# Patient Record
Sex: Female | Born: 1965
Health system: Southern US, Community
[De-identification: ages and names within clinical notes are randomized; demographics above are authoritative.]

## PROBLEM LIST (undated history)

## (undated) ENCOUNTER — Ambulatory Visit: Admission: EM

## (undated) DIAGNOSIS — F32A Depression, unspecified: Secondary | ICD-10-CM

## (undated) DIAGNOSIS — Z8619 Personal history of other infectious and parasitic diseases: Secondary | ICD-10-CM

## (undated) DIAGNOSIS — N951 Menopausal and female climacteric states: Secondary | ICD-10-CM

## (undated) DIAGNOSIS — G4700 Insomnia, unspecified: Secondary | ICD-10-CM

## (undated) DIAGNOSIS — E669 Obesity, unspecified: Secondary | ICD-10-CM

## (undated) DIAGNOSIS — K219 Gastro-esophageal reflux disease without esophagitis: Secondary | ICD-10-CM

## (undated) DIAGNOSIS — Z113 Encounter for screening for infections with a predominantly sexual mode of transmission: Secondary | ICD-10-CM

## (undated) DIAGNOSIS — I10 Essential (primary) hypertension: Secondary | ICD-10-CM

## (undated) DIAGNOSIS — F329 Major depressive disorder, single episode, unspecified: Secondary | ICD-10-CM

## (undated) DIAGNOSIS — K649 Unspecified hemorrhoids: Secondary | ICD-10-CM

## (undated) DIAGNOSIS — F419 Anxiety disorder, unspecified: Secondary | ICD-10-CM

## (undated) DIAGNOSIS — D649 Anemia, unspecified: Secondary | ICD-10-CM

## (undated) HISTORY — DX: Anemia, unspecified: D64.9

## (undated) HISTORY — DX: Anxiety disorder, unspecified: F41.9

## (undated) HISTORY — DX: Obesity, unspecified: E66.9

## (undated) HISTORY — PX: CHOLECYSTECTOMY: SHX55

## (undated) HISTORY — PX: TUBAL LIGATION: SHX77

## (undated) HISTORY — DX: Menopausal and female climacteric states: N95.1

## (undated) HISTORY — DX: Encounter for screening for infections with a predominantly sexual mode of transmission: Z11.3

## (undated) HISTORY — PX: BREAST REDUCTION SURGERY: SHX8

## (undated) HISTORY — DX: Depression, unspecified: F32.A

---
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
## 1998-06-30 HISTORY — PX: REDUCTION MAMMAPLASTY: SUR839

## 2011-07-01 HISTORY — PX: ESOPHAGEAL DILATION: SHX303

## 2013-09-04 ENCOUNTER — Emergency Department (HOSPITAL_COMMUNITY)
Admission: EM | Admit: 2013-09-04 | Discharge: 2013-09-04 | Discharge status: Absent without leave | Payer: Medicaid - Out of State | Source: Home / Self Care

## 2013-09-20 ENCOUNTER — Ambulatory Visit: Payer: Self-pay | Attending: Internal Medicine | Admitting: *Deleted

## 2013-09-20 DIAGNOSIS — F4323 Adjustment disorder with mixed anxiety and depressed mood: Secondary | ICD-10-CM

## 2013-09-21 NOTE — Progress Notes (Signed)
Patient presented with son (61) and daughter (76). Family has recently relocated from out of state and patient is concerned about everyone having a difficult time. Family identified that it is tough adjusting to a new environment but they find strength in being together and agree with the decision to move.  LCSW met with each person individually in order to address their individual needs within the family.  LCSW then scheduled follow ups for next week for individual sessions.  Christene Lye MSW, LCSW Duration 90 minutes

## 2013-09-29 ENCOUNTER — Other Ambulatory Visit: Payer: Self-pay

## 2014-01-13 ENCOUNTER — Encounter (HOSPITAL_COMMUNITY): Payer: Self-pay | Admitting: Emergency Medicine

## 2014-01-13 ENCOUNTER — Emergency Department (HOSPITAL_COMMUNITY)
Admission: EM | Admit: 2014-01-13 | Discharge: 2014-01-13 | Disposition: A | Discharge status: Home / Self Care | Payer: Medicaid - Out of State | Attending: Emergency Medicine | Admitting: Emergency Medicine

## 2014-01-13 DIAGNOSIS — Z760 Encounter for issue of repeat prescription: Secondary | ICD-10-CM | POA: Diagnosis not present

## 2014-01-13 DIAGNOSIS — J029 Acute pharyngitis, unspecified: Secondary | ICD-10-CM | POA: Diagnosis not present

## 2014-01-13 DIAGNOSIS — I10 Essential (primary) hypertension: Secondary | ICD-10-CM | POA: Diagnosis not present

## 2014-01-13 DIAGNOSIS — R42 Dizziness and giddiness: Secondary | ICD-10-CM | POA: Diagnosis not present

## 2014-01-13 DIAGNOSIS — H9209 Otalgia, unspecified ear: Secondary | ICD-10-CM | POA: Diagnosis not present

## 2014-01-13 DIAGNOSIS — J0100 Acute maxillary sinusitis, unspecified: Secondary | ICD-10-CM | POA: Diagnosis not present

## 2014-01-13 HISTORY — DX: Essential (primary) hypertension: I10

## 2014-01-13 LAB — I-STAT CHEM 8, ED
BUN: 4 mg/dL — ABNORMAL LOW (ref 6–23)
Calcium, Ion: 1.19 mmol/L (ref 1.12–1.23)
Chloride: 100 mEq/L (ref 96–112)
Creatinine, Ser: 0.7 mg/dL (ref 0.50–1.10)
Glucose, Bld: 92 mg/dL (ref 70–99)
HEMATOCRIT: 34 % — AB (ref 36.0–46.0)
HEMOGLOBIN: 11.6 g/dL — AB (ref 12.0–15.0)
POTASSIUM: 3.7 meq/L (ref 3.7–5.3)
SODIUM: 140 meq/L (ref 137–147)
TCO2: 29 mmol/L (ref 0–100)

## 2014-01-13 MED ORDER — DEXTROMETHORPHAN HBR 15 MG/5ML PO SYRP: 10.0000 mL | ORAL_SOLUTION | Freq: Four times a day (QID) | ORAL | Status: DC | PRN

## 2014-01-13 MED ORDER — FLUTICASONE PROPIONATE 50 MCG/ACT NA SUSP: 2.0000 | Freq: Every day | NASAL | Status: DC

## 2014-01-13 MED ORDER — LISINOPRIL 10 MG PO TABS: 10.0000 mg | ORAL_TABLET | Freq: Every day | ORAL | Status: DC

## 2014-01-13 NOTE — ED Provider Notes (Signed)
CSN: [id number]     Arrival date & time 01/13/14  8502 History   First MD Initiated Contact with Patient 01/13/14 0912     Chief Complaint  Patient presents with  . Hypertension     (Consider location/radiation/quality/duration/timing/severity/associated sxs/prior Treatment) HPI Comments: Patient is a 48 year old female with history of hypertension who presents today with concerns of elevated blood pressure. She reports that she recently moved here from [place] and has been out of her 10 mg lisinopril for 3 weeks. Prior to this she had been taking it daily for 3 years. She has been unable to set up care with her primary care physician due to insurance issues. She reports that she feels lightheaded. For the past week and half she has had sinus pressure, congestion, rhinorrhea, sore throat, ear pain. She is taking Benadryl and Claritin without relief of her symptoms. She has mild associated headache. No new numbness, weakness, chest pain, shortness of breath, palpitations.  The history is provided by the patient. No language interpreter was used.    Past Medical History  Diagnosis Date  . Hypertension    Past Surgical History  Procedure Laterality Date  . Cesarean section    . Breast reduction surgery    . Cholecystectomy     No family history on file. History  Substance Use Topics  . Smoking status: Never Smoker   . Smokeless tobacco: Not on file  . Alcohol Use: No   OB History   Grav Para Term Preterm Abortions TAB SAB Ect Mult Living                 Review of Systems  Constitutional: Negative for fever and chills.  HENT: Positive for congestion, ear pain, rhinorrhea, sinus pressure and sore throat.   Respiratory: Negative for shortness of breath.   Cardiovascular: Negative for chest pain, palpitations and leg swelling.  Gastrointestinal: Negative for nausea, vomiting and abdominal pain.  Neurological: Positive for light-headedness. Negative for dizziness, speech  difficulty, weakness and numbness.  All other systems reviewed and are negative.     Allergies  Review of patient's allergies indicates no known allergies.  Home Medications   Prior to Admission medications   Not on File   BP 156/81  Pulse 84  Temp(Src) 99.1 F (37.3 C) (Oral)  Resp 18  Ht 5\' 6"  (1.676 m)  Wt 290 lb (131.543 kg)  BMI 46.83 kg/m2  SpO2 95%  LMP 12/22/2013 Physical Exam  Nursing note and vitals reviewed. Constitutional: She is oriented to person, place, and time. She appears well-developed and well-nourished. She does not have a sickly appearance. She does not appear ill. No distress.  Patient is well-appearing, in no acute distress.  HENT:  Head: Normocephalic and atraumatic.  Right Ear: Tympanic membrane, external ear and ear canal normal.  Left Ear: Tympanic membrane, external ear and ear canal normal.  Nose: Mucosal edema present. No rhinorrhea. Right sinus exhibits maxillary sinus tenderness. Right sinus exhibits no frontal sinus tenderness. Left sinus exhibits maxillary sinus tenderness. Left sinus exhibits no frontal sinus tenderness.  Mouth/Throat: Oropharynx is clear and moist and mucous membranes are normal.  Swollen nasal turbinates bilaterally  Eyes: Conjunctivae and EOM are normal. Pupils are equal, round, and reactive to light.  Neck: Normal range of motion. Neck supple. No rigidity.  No nuchal rigidity or meningeal signs  Cardiovascular: Normal rate, regular rhythm, normal heart sounds, intact distal pulses and normal pulses.   Pulses:      Radial pulses  are 2+ on the right side, and 2+ on the left side.       Posterior tibial pulses are 2+ on the right side, and 2+ on the left side.  Pulmonary/Chest: Effort normal and breath sounds normal. No stridor. No respiratory distress. She has no wheezes. She has no rales.  Abdominal: Soft. She exhibits no distension. There is no tenderness.  Musculoskeletal: Normal range of motion.  Moves all  extremities without ataxia or guarding  Neurological: She is alert and oriented to person, place, and time. She has normal strength. No sensory deficit. GCS eye subscore is 4. GCS verbal subscore is 5. GCS motor subscore is 6.  Finger-nose-finger normal. Grip strength 5 out of 5 bilaterally.  Skin: Skin is warm and dry. She is not diaphoretic. No erythema.  Psychiatric: She has a normal mood and affect. Her behavior is normal.    ED Course  Procedures (including critical care time) Labs Review Labs Reviewed  I-STAT CHEM 8, ED - Abnormal; Notable for the following:    BUN 4 (*)    Hemoglobin 11.6 (*)    HCT 34.0 (*)    All other components within normal limits    Imaging Review No results found.   EKG Interpretation   Date/Time:  Friday January 13 2014 09:45:01 EDT Ventricular Rate:  66 PR Interval:  181 QRS Duration: 91 QT Interval:  411 QTC Calculation: 431 R Axis:   22 Text Interpretation:  Sinus rhythm Borderline T wave abnormalities No old  tracing to compare Confirmed by Winfred Leeds  MD, SAM [id number]) on 01/13/2014  11:04:01 AM      MDM   Final diagnoses:  Essential hypertension  Medication refill  Acute maxillary sinusitis, recurrence not specified   Mild to moderate symptoms of clear/yellow nasal discharge/congestion and scratchy throat with cough for less than 10 days.  Patient is afebrile.  No concern for acute bacterial rhinosinusitis; likely viral in nature.  Patient discharged with symptomatic treatment.  Patient instructions given for warm saline nasal washes.  Recommendations for follow-up with primary care physician.    Patient also with concerns of hypertension. She has been out of her Lisinopril for 3 weeks after moving from [place]. BP is mildly elevated in ED. Will refill rx and give resource guide. Dr. Winfred Leeds evaluated patient and agrees with plan. Patient / Family / Caregiver informed of clinical course, understand medical decision-making process, and  agree with plan.       Elwyn Lade, PA-C 01/16/14 1159

## 2014-01-13 NOTE — ED Provider Notes (Signed)
Complains of a heaviness intermittently for 3 days. Asymptomatic now. Without treatment She ran out of her blood pressure medicine 3 weeks ago. She denies chest pain no shortness of breath. No headache. No other associated symptoms. On exam alert Glasgow Coma Score 15 heart regular in rhythm no murmurs lungs clear auscultation abdomen obese nontender neurologic Glasgow Coma Score 15 pain is 2 through 12 intact motor strength 5 over 5 overall gait normal. Not lightheaded on standing  Orlie Dakin, MD 01/13/14 1111

## 2014-01-13 NOTE — ED Notes (Signed)
Pt here for BP check. Pt has history of hypertension and has been without her BP meds for a while. Pt reports feeling lightheaded.

## 2014-01-13 NOTE — Discharge Instructions (Signed)
Hypertension Hypertension is another name for high blood pressure. High blood pressure forces your heart to work harder to pump blood. A blood pressure reading has two numbers, which includes a higher number over a lower number (example: 110/72). HOME CARE   Have your blood pressure rechecked by your doctor.  Only take medicine as told by your doctor. Follow the directions carefully. The medicine does not work as well if you skip doses. Skipping doses also puts you at risk for problems.  Do not smoke.  Monitor your blood pressure at home as told by your doctor. GET HELP IF:  You think you are having a reaction to the medicine you are taking.  You have repeat headaches or feel dizzy.  You have puffiness (swelling) in your ankles.  You have trouble with your vision. GET HELP RIGHT AWAY IF:   You get a very bad headache and are confused.  You feel weak, numb, or faint.  You get chest or belly (abdominal) pain.  You throw up (vomit).  You cannot breathe very well. MAKE SURE YOU:   Understand these instructions.  Will watch your condition.  Will get help right away if you are not doing well or get worse. Document Released: 12/03/2007 Document Revised: 06/21/2013 Document Reviewed: 04/08/2013 [facility] Patient Information 2015 [place], [place]. This information is not intended to replace advice given to you by your health care provider. Make sure you discuss any questions you have with your health care provider.  Sinusitis Sinusitis is redness, soreness, and puffiness (inflammation) of the air pockets in the bones of your face (sinuses). The redness, soreness, and puffiness can cause air and mucus to get trapped in your sinuses. This can allow germs to grow and cause an infection.  HOME CARE   Drink enough fluids to keep your pee (urine) clear or pale yellow.  Use a humidifier in your home.  Run a hot shower to create steam in the bathroom. Sit in the bathroom with the door  closed. Breathe in the steam 3-4 times a day.  Put a warm, moist washcloth on your face 3-4 times a day, or as told by your doctor.  Use salt water sprays (saline sprays) to wet the thick fluid in your nose. This can help the sinuses drain.  Only take medicine as told by your doctor. GET HELP RIGHT AWAY IF:   Your pain gets worse.  You have very bad headaches.  You are sick to your stomach (nauseous).  You throw up (vomit).  You are very sleepy (drowsy) all the time.  Your face is puffy (swollen).  Your vision changes.  You have a stiff neck.  You have trouble breathing. MAKE SURE YOU:   Understand these instructions.  Will watch your condition.  Will get help right away if you are not doing well or get worse. Document Released: 12/03/2007 Document Revised: 03/10/2012 Document Reviewed: 01/20/2012 [facility] Patient Information 2015 [place], [place]. This information is not intended to replace advice given to you by your health care provider. Make sure you discuss any questions you have with your health care provider.   Emergency Department Resource Guide 1) Find a Doctor and Pay Out of Pocket Although you won't have to find out who is covered by your insurance plan, it is a good idea to ask around and get recommendations. You will then need to call the office and see if the doctor you have chosen will accept you as a new patient and what types of  options they offer for patients who are self-pay. Some doctors offer discounts or will set up payment plans for their patients who do not have insurance, but you will need to ask so you aren't surprised when you get to your appointment.  2) Contact Your Local Health Department Not all health departments have doctors that can see patients for sick visits, but many do, so it is worth a call to see if yours does. If you don't know where your local health department is, you can check in your phone book. The CDC also has a tool to help you  locate your state's health department, and many state websites also have listings of all of their local health departments.  3) Find a [facility] If your illness is not likely to be very severe or complicated, you may want to try a walk in clinic. These are popping up all over the country in pharmacies, drugstores, and shopping centers. They're usually staffed by nurse practitioners or physician assistants that have been trained to treat common illnesses and complaints. They're usually fairly quick and inexpensive. However, if you have serious medical issues or chronic medical problems, these are probably not your best option.  No Primary Care Doctor: - Call Health Connect at  [phone] - they can help you locate a primary care doctor that  accepts your insurance, provides certain services, etc. - Physician Referral Service- [phone]  Chronic Pain Problems: Organization         Address  Phone   Notes  [facility]  [phone] Patients need to be referred by their primary care doctor.   Medication Assistance: Organization         Address  Phone   Notes  [facility] [address] [facility] [address] [place], [place] [address] [phone] --Must be a resident of [facility] -- Must have NO insurance coverage whatsoever (no Medicaid/ Medicare, etc.) -- The pt. MUST have a primary care doctor that directs their care regularly and follows them in the community   MedAssist  [phone]   [employer]  [phone]    Agencies that provide inexpensive medical care: Organization         Address  Phone   Notes  [place]  [phone]   [facility]    [phone]   [facility] [place], [place] [address] [phone]   [place] [address], [place] [phone]   Planned Parenthood    [phone]   [facility]    [phone]   [place] and [place] [address], [place] Phone:  [phone], Fax:  [phone] Hours of Operation:  9 am - 6 pm, M-F.  Also accepts Medicaid/Medicare and self-pay.  [facility] for [place] [address], [address], [place] Phone: [phone], Fax: [phone]. Hours of Operation:  8:30 am - 5:30 pm, M-F.  Also accepts Medicaid and self-pay.  [facility] [address] [place] Phone: [phone]   [place], [place], [place] [phone], Ext. [phone] Mondays & Thursdays: 7-9 AM.  First 15 patients are seen on a first come, first serve basis.    [place] Providers:  Organization         Address  Phone   Notes  [facility] [address], [place] [phone] Also accepts self-pay patients.  [facility] [address], [place]  [phone]   [place], [address], [place] [phone]   [facility] [address], [place] [phone]   Lucianne Lei [address], [place]   [phone] Only accepts [place] Access [place] patients after they have their name applied to their card.   Self-Pay (no insurance) in [facility]:  Organization         Address  Phone   Notes  Sickle Cell Patients, [facility] [place] [phone]   [facility] [place] [phone]   [facility] [place]  [place], [place], [place] [phone]   [facility]/Dr. Osei-Bonsu  [address] [place] or [address], [place] [phone] Phone number for both [place] and [place] locations is the same.  [facility]gent Medical and [facility] [address],  [place] [phone]   [facility] [address], [place] or [address] [phone]   [facility] [address], [place] [phone], phone; [phone], fax Sees patients 1st and 3rd Saturday of every month.  Must not qualify for public or private insurance (i.e. Medicaid, Medicare, [place] Health Choice, Veterans' Benefits)  Household income should be no more than 200% of the poverty level The clinic cannot treat you if you are pregnant or think you are pregnant  Sexually transmitted diseases are not treated at the clinic.    Dental Care: Organization         Address  Phone  Notes  [facility] Department of [facility] [phone] Accepts children up to age 74 who are enrolled in [place] or [place]; pregnant women with a Medicaid card; and children who have applied for Medicaid or [place] Health Choice, but were declined, whose parents can pay a reduced fee at time of service.  [facility] Department of [facility]  [address], [place] [phone] Accepts children up to age 28 who are enrolled in [place] or [place]; pregnant women with a Medicaid card; and children who have applied for Medicaid or [place] Health Choice, but were declined, whose parents can pay a reduced fee at time of service.  [facility] Adult Dental Access PROGRAM  [place] [phone] Patients are seen by appointment only. Walk-ins are not accepted. [place] will see patients 53 years of age and older. Monday - Tuesday (8am-5pm) Most Wednesdays (8:30-5pm) $30 per visit, cash only  [facility] Adult Dental Access PROGRAM  [address], [facility] [phone] Patients are seen by appointment only. Walk-ins are not accepted. [place] will see patients 28 years of age and older. One Wednesday Evening  (Monthly: Volunteer Based).  $30 per visit, cash only  [place]  [phone] for adults; Children under age 21, call Graduate Pediatric Dentistry at [phone]. Children aged 91-14, please call [phone] to request a pediatric application.  Dental services are provided in all areas of dental care including fillings, crowns and bridges, complete and partial  dentures, implants, gum treatment, root canals, and extractions. Preventive care is also provided. Treatment is provided to both adults and children. Patients are selected via a lottery and there is often a waiting list.   [facility] [address] [place]  [phone] [email or web address]   [facility] [address] [place], [place] [phone], Ext. [phone] Second and Fourth Thursday of each month, opens at 6:30 AM; Clinic ends at 9 AM.  Patients are seen on a first-come first-served basis, and a limited number are seen during each clinic.   [facility]  [address] Hillard Danker [place], [place] [phone]   Eligibility Requirements You must have lived in [place], [place], or [place] counties for at least the last three months.   You cannot be eligible for state or federal sponsored [employer], including [employer], [place], or [employer].   You generally cannot be eligible for healthcare insurance through your employer.    How to apply: Eligibility screenings are held every Tuesday and Wednesday afternoon from 1:00 pm until 4:00 pm. You do not need an appointment for the interview!  [facility] [address], [place], [place]   [place]  [place] Department  [place]  [phone]    Behavioral Health Resources in the Community: Intensive Outpatient Programs Organization         Address  Phone  Notes  [place]. [address], [place], [place] [phone]   [facility] Outpatient [address], [place], [place]   ADS: Alcohol & Drug Svcs [address] [place], [place]   [place] [address]  [place], [place] or [phone]   Substance Abuse Resources Organization         Address  Phone  Notes  Alcohol and Drug Services  [phone]   [place]  [phone]   [facility]   Chinita Pester  [phone]   Residential & Outpatient Substance Abuse Program  [phone]   Psychological Services Organization         Address  Phone  Notes  [facility] [place]  [place]  [phone]   [place] [address] [place] or [phone]    Mobile Crisis Teams Organization         Address  Phone  Notes  [facility], Mobile Crisis Care Unit  [phone]   [facility]  [address] [place], [place]   [place] [address], [place] [phone]    Self-Help/Support Groups Organization         Address  Phone             Notes  [place]. of [place] - variety of support groups  [place] Call for more information  Narcotics Anonymous (NA), [facility] [address], [employer] [place]  2 meetings at this location   Special educational needs teacher         Address  Phone  Notes  [facility] Residential Treatment [place],    [place]  [phone]   [facility]  [address], [place] [address], [place], [place]   [place], [place] [phone] Admissions: 8am-3pm M-F  Incentives  Substance [place] [address]    [place], [place] [phone]   [facility] [address] [place],  [place], [place]   [facility] [address]  [place], [place]   [facility] Programs - Intensive Outpatient [address], [place], [place]   [facility] ([place].,  [place], [place] [phone] or [phone]   [facility] Services (RTS) [address] [place], [place] Accepts Medicaid  [place] [address]  [place] [phone] Substance Abuse/Addiction Treatment   [facility] Organization         Address  Phone  Notes  [facility]  [phone]   Domenic Schwab, PhD [address] Arlis Porta [place], [place]   [phone] or [phone]   [place], [place] [phone]   [facility] [address] [place], [place] [phone] Insurance/Medicaid/sponsorship through [facility] and Families [address], [address] [place]                                    [place], [place] [phone] [place] [address], [place] [phone]    Dr. Adele Schilder  [phone]   [facility] Dept. 1) [address] [place] 2) [place] 3)  [place] [address], [place] [phone]  [phone]   [place] [phone] or [phone] (After Hours)

## 2014-01-13 NOTE — ED Notes (Signed)
Pt states she ran out of Lisinopril several weeks ago, denies pain at the time. Respirations unlabored. Pt is alert and oriented x4. Vital signs stable. No signs of distress noted. Pt placed on cardiac monitor.

## 2014-01-13 NOTE — Discharge Planning (Signed)
[place] to patient regarding primary care resources and finding a provider. Patient states she applied for medicaid when she moved to the area and received a type medicaid that can only be used at the hospital. Liaison instructed patient to follow up with the Department of Social Services for understanding of her current medicaid benefits. Resource guide and my contact information provided for any future questions or concerns. No other Community Liaison needs identified at this time.

## 2014-01-16 NOTE — ED Provider Notes (Signed)
Medical screening examination/treatment/procedure(s) were conducted as a shared visit with non-physician practitioner(s) and myself.  I personally evaluated the patient during the encounter.   EKG Interpretation   Date/Time:  Friday January 13 2014 09:45:01 EDT Ventricular Rate:  66 PR Interval:  181 QRS Duration: 91 QT Interval:  411 QTC Calculation: 431 R Axis:   22 Text Interpretation:  Sinus rhythm Borderline T wave abnormalities No old  tracing to compare Confirmed by Winfred Leeds  MD, Dmario Russom [phone]) on 01/13/2014  11:04:01 AM       Orlie Dakin, MD 01/16/14 1622

## 2014-02-05 ENCOUNTER — Emergency Department (HOSPITAL_COMMUNITY)
Admission: EM | Admit: 2014-02-05 | Discharge: 2014-02-05 | Discharge status: Absent without leave | Payer: Medicaid - Out of State | Source: Home / Self Care

## 2014-08-01 ENCOUNTER — Encounter: Payer: Self-pay | Admitting: Adult Health

## 2014-08-01 ENCOUNTER — Other Ambulatory Visit: Payer: Self-pay | Admitting: Adult Health

## 2014-08-09 ENCOUNTER — Other Ambulatory Visit: Payer: Self-pay | Admitting: Adult Health

## 2014-08-16 ENCOUNTER — Other Ambulatory Visit: Payer: Self-pay | Admitting: Adult Health

## 2014-08-19 ENCOUNTER — Emergency Department (HOSPITAL_COMMUNITY)
Admission: EM | Admit: 2014-08-19 | Discharge: 2014-08-19 | Disposition: A | Discharge status: Home / Self Care | Payer: Medicaid Other | Attending: Emergency Medicine | Admitting: Emergency Medicine

## 2014-08-19 ENCOUNTER — Encounter (HOSPITAL_COMMUNITY): Payer: Self-pay | Admitting: Emergency Medicine

## 2014-08-19 DIAGNOSIS — R1084 Generalized abdominal pain: Secondary | ICD-10-CM | POA: Diagnosis not present

## 2014-08-19 DIAGNOSIS — Z79899 Other long term (current) drug therapy: Secondary | ICD-10-CM | POA: Diagnosis not present

## 2014-08-19 DIAGNOSIS — R109 Unspecified abdominal pain: Secondary | ICD-10-CM

## 2014-08-19 DIAGNOSIS — R1110 Vomiting, unspecified: Secondary | ICD-10-CM | POA: Diagnosis present

## 2014-08-19 DIAGNOSIS — Z7951 Long term (current) use of inhaled steroids: Secondary | ICD-10-CM | POA: Insufficient documentation

## 2014-08-19 LAB — COMPREHENSIVE METABOLIC PANEL
ALT: 23 U/L (ref 0–35)
AST: 20 U/L (ref 0–37)
Albumin: 3.8 g/dL (ref 3.5–5.2)
Alkaline Phosphatase: 48 U/L (ref 39–117)
Anion gap: 5 (ref 5–15)
BILIRUBIN TOTAL: 0.5 mg/dL (ref 0.3–1.2)
BUN: 8 mg/dL (ref 6–23)
CO2: 27 mmol/L (ref 19–32)
Calcium: 8.5 mg/dL (ref 8.4–10.5)
Chloride: 104 mmol/L (ref 96–112)
Creatinine, Ser: 0.61 mg/dL (ref 0.50–1.10)
GLUCOSE: 84 mg/dL (ref 70–99)
POTASSIUM: 3.4 mmol/L — AB (ref 3.5–5.1)
Sodium: 136 mmol/L (ref 135–145)
TOTAL PROTEIN: 7.6 g/dL (ref 6.0–8.3)

## 2014-08-19 LAB — CBC WITH DIFFERENTIAL/PLATELET
Basophils Absolute: 0 10*3/uL (ref 0.0–0.1)
Basophils Relative: 0 % (ref 0–1)
EOS PCT: 1 % (ref 0–5)
Eosinophils Absolute: 0.1 10*3/uL (ref 0.0–0.7)
HCT: 34.5 % — ABNORMAL LOW (ref 36.0–46.0)
Hemoglobin: 10.9 g/dL — ABNORMAL LOW (ref 12.0–15.0)
Lymphocytes Relative: 46 % (ref 12–46)
Lymphs Abs: 2.5 10*3/uL (ref 0.7–4.0)
MCH: 30.3 pg (ref 26.0–34.0)
MCHC: 31.6 g/dL (ref 30.0–36.0)
MCV: 95.8 fL (ref 78.0–100.0)
MONO ABS: 0.4 10*3/uL (ref 0.1–1.0)
MONOS PCT: 8 % (ref 3–12)
Neutro Abs: 2.5 10*3/uL (ref 1.7–7.7)
Neutrophils Relative %: 45 % (ref 43–77)
Platelets: 289 10*3/uL (ref 150–400)
RBC: 3.6 MIL/uL — AB (ref 3.87–5.11)
RDW: 13.8 % (ref 11.5–15.5)
WBC: 5.6 10*3/uL (ref 4.0–10.5)

## 2014-08-19 LAB — LIPASE, BLOOD: Lipase: 19 U/L (ref 11–59)

## 2014-08-19 MED ORDER — AMOXICILLIN 500 MG PO TABS: 1000.0000 mg | ORAL_TABLET | Freq: Two times a day (BID) | ORAL | Status: DC

## 2014-08-19 MED ORDER — CLARITHROMYCIN 500 MG PO TABS: 500.0000 mg | ORAL_TABLET | Freq: Two times a day (BID) | ORAL | Status: DC

## 2014-08-19 MED ORDER — PANTOPRAZOLE SODIUM 40 MG PO TBEC: 20.0000 mg | DELAYED_RELEASE_TABLET | Freq: Two times a day (BID) | ORAL | Status: DC

## 2014-08-19 NOTE — ED Provider Notes (Signed)
CSN: [id number]     Arrival date & time 08/19/14  1631 History  This chart was scribed for Maudry Diego, MD by Tula Nakayama, ED Scribe. This patient was seen in room APA06/AP[facility]06 and the patient's care was started at 4:55 PM.    Chief Complaint  Patient presents with  . Emesis   Patient is a 49 y.o. female presenting with abdominal pain. The history is provided by the patient. No language interpreter was used.  Abdominal Pain Pain location:  Generalized Pain quality: aching   Pain radiates to:  Does not radiate Pain severity:  Mild Onset quality:  Gradual Timing:  Constant Progression:  Unable to specify Chronicity:  Recurrent Relieved by:  Nothing Worsened by:  Nothing tried Ineffective treatments:  None tried Associated symptoms: nausea   Associated symptoms: no chest pain, no cough, no diarrhea, no fatigue, no fever and no hematuria     HPI Comments: Sheila Yoder is a 49 y.o. female with a history of HTN who presents to the Emergency Department complaining of constant, mild generalized abdominal pain that started a few days ago. She states nausea as an associated symptom. Pt notes her current symptoms are consistent with history of H. Pylori which she gets intermittently. She denies dysuria, fever and diarrhea as associated symptoms.  No PCP Past Medical History  Diagnosis Date  . Hypertension    Past Surgical History  Procedure Laterality Date  . Cesarean section    . Breast reduction surgery    . Cholecystectomy     Family History  Problem Relation Age of Onset  . Diabetes Other   . Hypertension Other    History  Substance Use Topics  . Smoking status: Never Smoker   . Smokeless tobacco: Never Used  . Alcohol Use: No   OB History    Gravida Para Term Preterm AB TAB SAB Ectopic Multiple Living   2 2 2       2      Review of Systems  Constitutional: Negative for fever, appetite change and fatigue.  HENT: Negative for congestion, ear discharge and sinus  pressure.   Eyes: Negative for discharge.  Respiratory: Negative for cough.   Cardiovascular: Negative for chest pain.  Gastrointestinal: Positive for nausea and abdominal pain. Negative for diarrhea.  Genitourinary: Negative for frequency and hematuria.  Musculoskeletal: Negative for back pain.  Skin: Negative for rash.  Neurological: Negative for seizures and headaches.  Psychiatric/Behavioral: Negative for hallucinations.  All other systems reviewed and are negative.     Allergies  Review of patient's allergies indicates no known allergies.  Home Medications   Prior to Admission medications   Medication Sig Start Date End Date Taking? Authorizing Provider  acetaminophen (TYLENOL) 500 MG tablet Take 1,000 mg by mouth 2 (two) times daily as needed for headache.    Historical Provider, MD  dextromethorphan 15 MG/5ML syrup Take 10 mLs (30 mg total) by mouth 4 (four) times daily as needed for cough. 01/13/14   Elwyn Lade, PA-C  fluticasone (FLONASE) 50 MCG/ACT nasal spray Place 2 sprays into both nostrils daily. 01/13/14   Kara Mead Merrell, PA-C  GARLIC PO Take 30 mg by mouth daily.    Historical Provider, MD  ibuprofen (ADVIL,MOTRIN) 200 MG tablet Take 400 mg by mouth daily as needed for headache.    Historical Provider, MD  lisinopril (PRINIVIL,ZESTRIL) 10 MG tablet Take 1 tablet (10 mg total) by mouth daily. 01/13/14   Elwyn Lade, PA-C  Omega-3  Fatty Acids (OMEGA 3 PO) Take 1 capsule by mouth daily.    Historical Provider, MD   BP 152/89 mmHg  Pulse 65  Temp(Src) 98.6 F (37 C) (Oral)  Resp 18  Ht 5\' 6"  (1.676 m)  Wt 275 lb (124.739 kg)  BMI 44.41 kg/m2  SpO2 100%  LMP 08/17/2014 Physical Exam  Constitutional: She is oriented to person, place, and time. She appears well-developed.  HENT:  Head: Normocephalic.  Eyes: Conjunctivae and EOM are normal. No scleral icterus.  Neck: Neck supple. No thyromegaly present.  Cardiovascular: Normal rate and regular rhythm.   Exam reveals no gallop and no friction rub.   No murmur heard. Pulmonary/Chest: No stridor. She has no wheezes. She has no rales. She exhibits no tenderness.  Abdominal: She exhibits no distension. There is no tenderness. There is no rebound.  Musculoskeletal: Normal range of motion. She exhibits no edema.  Lymphadenopathy:    She has no cervical adenopathy.  Neurological: She is oriented to person, place, and time. She exhibits normal muscle tone. Coordination normal.  Skin: No rash noted. No erythema.  Psychiatric: She has a normal mood and affect. Her behavior is normal.  Nursing note and vitals reviewed.   ED Course  Procedures (including critical care time) DIAGNOSTIC STUDIES: Oxygen Saturation is 100% on RA, normal by my interpretation.    COORDINATION OF CARE: 4:59 PM Discussed treatment plan with pt which includes lab work. Pt agreed to plan.  Labs Review Labs Reviewed - No data to display  Imaging Review No results found.   EKG Interpretation None      MDM   Final diagnoses:  None    abd pain,   Pt states she has had h. Pylori and it felt just like this.   Will tx for h pylori   The chart was scribed for me under my direct supervision.  I personally performed the history, physical, and medical decision making and all procedures in the evaluation of this patient.Maudry Diego, MD 08/19/14 [phone]

## 2014-08-19 NOTE — Discharge Instructions (Signed)
Follow up in 1-2 weeks for recheck

## 2014-08-19 NOTE — ED Notes (Addendum)
Patient c/o nausea, vomiting, and pain with urination. Denies fevers or diarrhea. Per patient "stomach irritation but no real pain."

## 2014-08-22 ENCOUNTER — Other Ambulatory Visit: Payer: Self-pay | Admitting: Adult Health

## 2014-08-23 ENCOUNTER — Other Ambulatory Visit (HOSPITAL_COMMUNITY)
Admission: RE | Admit: 2014-08-23 | Discharge: 2014-08-23 | Disposition: A | Discharge status: Home / Self Care | Payer: Medicaid Other | Source: Ambulatory Visit | Attending: Adult Health | Admitting: Adult Health

## 2014-08-23 ENCOUNTER — Encounter: Payer: Self-pay | Admitting: Adult Health

## 2014-08-23 ENCOUNTER — Ambulatory Visit (INDEPENDENT_AMBULATORY_CARE_PROVIDER_SITE_OTHER): Payer: Medicaid Other | Admitting: Adult Health

## 2014-08-23 ENCOUNTER — Other Ambulatory Visit: Payer: Self-pay | Admitting: Adult Health

## 2014-08-23 VITALS — BP 140/80 | HR 83 | Ht 65.5 in | Wt 318.0 lb

## 2014-08-23 DIAGNOSIS — Z113 Encounter for screening for infections with a predominantly sexual mode of transmission: Secondary | ICD-10-CM | POA: Diagnosis present

## 2014-08-23 DIAGNOSIS — Z01419 Encounter for gynecological examination (general) (routine) without abnormal findings: Secondary | ICD-10-CM | POA: Diagnosis not present

## 2014-08-23 DIAGNOSIS — Z0000 Encounter for general adult medical examination without abnormal findings: Secondary | ICD-10-CM

## 2014-08-23 DIAGNOSIS — N951 Menopausal and female climacteric states: Secondary | ICD-10-CM

## 2014-08-23 DIAGNOSIS — Z1212 Encounter for screening for malignant neoplasm of rectum: Secondary | ICD-10-CM

## 2014-08-23 DIAGNOSIS — Z1151 Encounter for screening for human papillomavirus (HPV): Secondary | ICD-10-CM | POA: Diagnosis present

## 2014-08-23 HISTORY — DX: Menopausal and female climacteric states: N95.1

## 2014-08-23 HISTORY — DX: Encounter for screening for infections with a predominantly sexual mode of transmission: Z11.3

## 2014-08-23 LAB — HEMOCCULT GUIAC POC 1CARD (OFFICE): Fecal Occult Blood, POC: NEGATIVE

## 2014-08-23 NOTE — Progress Notes (Signed)
Patient ID: Sheila Yoder, female   DOB: [birth year], 49 y.o.   MRN: [id number] History of Present Illness: Sheila Yoder is a 49 year old black female,sinlge, new to this practice in for well woman gyn exam and pap.She complains of skipping periods and some hot flashes and not sleeping as well.Has sinus issues.Was seen in ER recent.y and treated for H pylori.   Current Medications, Allergies, Past Medical History, Past Surgical History, Family History and Social History were reviewed in [employer] record.     Review of Systems: Patient denies any daily headaches, hearing loss, fatigue, blurred vision, shortness of breath, chest pain, abdominal pain, problems with bowel movements, urination, or intercourse. No joint pain or mood swings.See HPI for positives.    Physical Exam:BP 140/80 mmHg  Pulse 83  Ht 5' 5.5" (1.664 m)  Wt 318 lb (144.244 kg)  BMI 52.09 kg/m2  LMP 08/17/2014 General:  Well developed, well nourished, no acute distress Skin:  Warm and dry Neck:  Midline trachea, normal thyroid, good ROM, no lymphadenopathy Lungs; Clear to auscultation bilaterally Breast:  No dominant palpable mass, retraction, or nipple discharge,sp breast reduction Cardiovascular: Regular rate and rhythm Abdomen:  Soft, non tender, no hepatosplenomegaly.obese Pelvic:  External genitalia is normal in appearance, no lesions.  The vagina is normal in appearance, with good color, moisture and rugae. Urethra has no lesions or masses. The cervix is bulbous.Pap with HPV and GC/CHL performed.  Uterus is felt to be normal size, shape, and contour.  No adnexal masses or tenderness noted.Bladder is non tender, no masses felt.Difficult secondary to abdominal girth. Rectal: Good sphincter tone, no polyps, or hemorrhoids felt.  Hemoccult negative. Extremities/musculoskeletal:  No swelling or varicosities noted, no clubbing or cyanosis Psych:  No mood changes, alert and cooperative,seems happy She wants  labs and STD screening. Discussed peri menopause with her.  Impression: Well woman gyn exam with pap Peri menopausal symptoms STD screening   Plan: Review handout on peri menopause Check CBC,CMP,TSH and lipids and HIV,RPR and HSV 2, will talk more when labs back Physical in 1 year Mammogram now and yearly  Colonoscopy at 89

## 2014-08-23 NOTE — Patient Instructions (Signed)
Physical in 1 year Mammogram now and yearly   [phone]  Will talk when labs back  Menopause Menopause is the normal time of life when menstrual periods stop completely. Menopause is complete when you have missed 12 consecutive menstrual periods. It usually occurs between the ages of 42 years and 74 years. Very rarely does a woman develop menopause before the age of 54 years. At menopause, your ovaries stop producing the female hormones estrogen and progesterone. This can cause undesirable symptoms and also affect your health. Sometimes the symptoms may occur 4-5 years before the menopause begins. There is no relationship between menopause and:  Oral contraceptives.  Number of children you had.  Race.  The age your menstrual periods started (menarche). Heavy smokers and very thin women may develop menopause earlier in life. CAUSES  The ovaries stop producing the female hormones estrogen and progesterone.  Other causes include:  Surgery to remove both ovaries.  The ovaries stop functioning for no known reason.  Tumors of the pituitary gland in the brain.  Medical disease that affects the ovaries and hormone production.  Radiation treatment to the abdomen or pelvis.  Chemotherapy that affects the ovaries. SYMPTOMS   Hot flashes.  Night sweats.  Decrease in sex drive.  Vaginal dryness and thinning of the vagina causing painful intercourse.  Dryness of the skin and developing wrinkles.  Headaches.  Tiredness.  Irritability.  Memory problems.  Weight gain.  Bladder infections.  Hair growth of the face and chest.  Infertility. More serious symptoms include:  Loss of bone (osteoporosis) causing breaks (fractures).  Depression.  Hardening and narrowing of the arteries (atherosclerosis) causing heart attacks and strokes. DIAGNOSIS   When the menstrual periods have stopped for 12 straight months.  Physical exam.  Hormone studies of the blood. TREATMENT   There are many treatment choices and nearly as many questions about them. The decisions to treat or not to treat menopausal changes is an individual choice made with your health care provider. Your health care provider can discuss the treatments with you. Together, you can decide which treatment will work best for you. Your treatment choices may include:   Hormone therapy (estrogen and progesterone).  Non-hormonal medicines.  Treating the individual symptoms with medicine (for example antidepressants for depression).  Herbal medicines that may help specific symptoms.  Counseling by a psychiatrist or psychologist.  Group therapy.  Lifestyle changes including:  Eating healthy.  Regular exercise.  Limiting caffeine and alcohol.  Stress management and meditation.  No treatment. HOME CARE INSTRUCTIONS   Take the medicine your health care provider gives you as directed.  Get plenty of sleep and rest.  Exercise regularly.  Eat a diet that contains calcium (good for the bones) and soy products (acts like estrogen hormone).  Avoid alcoholic beverages.  Do not smoke.  If you have hot flashes, dress in layers.  Take supplements, calcium, and vitamin D to strengthen bones.  You can use over-the-counter lubricants or moisturizers for vaginal dryness.  Group therapy is sometimes very helpful.  Acupuncture may be helpful in some cases. SEEK MEDICAL CARE IF:   You are not sure you are in menopause.  You are having menopausal symptoms and need advice and treatment.  You are still having menstrual periods after age 25 years.  You have pain with intercourse.  Menopause is complete (no menstrual period for 12 months) and you develop vaginal bleeding.  You need a referral to a specialist (gynecologist, psychiatrist, or psychologist) for  treatment. SEEK IMMEDIATE MEDICAL CARE IF:   You have severe depression.  You have excessive vaginal bleeding.  You fell and think you  have a broken bone.  You have pain when you urinate.  You develop leg or chest pain.  You have a fast pounding heart beat (palpitations).  You have severe headaches.  You develop vision problems.  You feel a lump in your breast.  You have abdominal pain or severe indigestion. Document Released: 09/06/2003 Document Revised: 02/16/2013 Document Reviewed: 01/13/2013 [facility] Patient Information 2015 [place], [place]. This information is not intended to replace advice given to you by your health care provider. Make sure you discuss any questions you have with your health care provider. Perimenopause Perimenopause is the time when your body begins to move into the menopause (no menstrual period for 12 straight months). It is a natural process. Perimenopause can begin 2-8 years before the menopause and usually lasts for 1 year after the menopause. During this time, your ovaries may or may not produce an egg. The ovaries vary in their production of estrogen and progesterone hormones each month. This can cause irregular menstrual periods, difficulty getting pregnant, vaginal bleeding between periods, and uncomfortable symptoms. CAUSES  Irregular production of the ovarian hormones, estrogen and progesterone, and not ovulating every month.  Other causes include:  Tumor of the pituitary gland in the brain.  Medical disease that affects the ovaries.  Radiation treatment.  Chemotherapy.  Unknown causes.  Heavy smoking and excessive alcohol intake can bring on perimenopause sooner. SIGNS AND SYMPTOMS   Hot flashes.  Night sweats.  Irregular menstrual periods.  Decreased sex drive.  Vaginal dryness.  Headaches.  Mood swings.  Depression.  Memory problems.  Irritability.  Tiredness.  Weight gain.  Trouble getting pregnant.  The beginning of losing bone cells (osteoporosis).  The beginning of hardening of the arteries (atherosclerosis). DIAGNOSIS  Your health care  provider will make a diagnosis by analyzing your age, menstrual history, and symptoms. He or she will do a physical exam and note any changes in your body, especially your female organs. Female hormone tests may or may not be helpful depending on the amount of female hormones you produce and when you produce them. However, other hormone tests may be helpful to rule out other problems. TREATMENT  In some cases, no treatment is needed. The decision on whether treatment is necessary during the perimenopause should be made by you and your health care provider based on how the symptoms are affecting you and your lifestyle. Various treatments are available, such as:  Treating individual symptoms with a specific medicine for that symptom.  Herbal medicines that can help specific symptoms.  Counseling.  Group therapy. HOME CARE INSTRUCTIONS   Keep track of your menstrual periods (when they occur, how heavy they are, how long between periods, and how long they last) as well as your symptoms and when they started.  Only take over-the-counter or prescription medicines as directed by your health care provider.  Sleep and rest.  Exercise.  Eat a diet that contains calcium (good for your bones) and soy (acts like the estrogen hormone).  Do not smoke.  Avoid alcoholic beverages.  Take vitamin supplements as recommended by your health care provider. Taking vitamin E may help in certain cases.  Take calcium and vitamin D supplements to help prevent bone loss.  Group therapy is sometimes helpful.  Acupuncture may help in some cases. SEEK MEDICAL CARE IF:   You have questions about  any symptoms you are having.  You need a referral to a specialist (gynecologist, psychiatrist, or psychologist). SEEK IMMEDIATE MEDICAL CARE IF:   You have vaginal bleeding.  Your period lasts longer than 8 days.  Your periods are recurring sooner than 21 days.  You have bleeding after intercourse.  You have  severe depression.  You have pain when you urinate.  You have severe headaches.  You have vision problems. Document Released: 07/24/2004 Document Revised: 04/06/2013 Document Reviewed: 01/13/2013 [facility] Patient Information 2015 [place], [place]. This information is not intended to replace advice given to you by your health care provider. Make sure you discuss any questions you have with your health care provider. Physical in 1 year Colonoscopy at 78

## 2014-08-24 LAB — RPR: RPR Ser Ql: NONREACTIVE

## 2014-08-24 LAB — CBC
HCT: 35 % (ref 34.0–46.6)
Hemoglobin: 11.5 g/dL (ref 11.1–15.9)
MCH: 29.8 pg (ref 26.6–33.0)
MCHC: 32.9 g/dL (ref 31.5–35.7)
MCV: 91 fL (ref 79–97)
PLATELETS: 328 10*3/uL (ref 150–379)
RBC: 3.86 x10E6/uL (ref 3.77–5.28)
RDW: 14.3 % (ref 12.3–15.4)
WBC: 5.3 10*3/uL (ref 3.4–10.8)

## 2014-08-24 LAB — COMPREHENSIVE METABOLIC PANEL
ALK PHOS: 56 IU/L (ref 39–117)
ALT: 17 IU/L (ref 0–32)
AST: 14 IU/L (ref 0–40)
Albumin/Globulin Ratio: 1.5 (ref 1.1–2.5)
Albumin: 4.3 g/dL (ref 3.5–5.5)
BILIRUBIN TOTAL: 0.3 mg/dL (ref 0.0–1.2)
BUN/Creatinine Ratio: 14 (ref 9–23)
BUN: 10 mg/dL (ref 6–24)
CO2: 23 mmol/L (ref 18–29)
Calcium: 9.5 mg/dL (ref 8.7–10.2)
Chloride: 101 mmol/L (ref 97–108)
Creatinine, Ser: 0.71 mg/dL (ref 0.57–1.00)
GFR calc non Af Amer: 102 mL/min/{1.73_m2} (ref >59)
GFR, EST AFRICAN AMERICAN: 118 mL/min/{1.73_m2} (ref >59)
GLOBULIN, TOTAL: 2.8 g/dL (ref 1.5–4.5)
Glucose: 91 mg/dL (ref 65–99)
Potassium: 4.2 mmol/L (ref 3.5–5.2)
Sodium: 141 mmol/L (ref 134–144)
TOTAL PROTEIN: 7.1 g/dL (ref 6.0–8.5)

## 2014-08-24 LAB — HSV 2 ANTIBODY, IGG: HSV 2 Glycoprotein G Ab, IgG: <0.91 index (ref 0.00–0.90)

## 2014-08-24 LAB — LIPID PANEL
Chol/HDL Ratio: 4.2 ratio units (ref 0.0–4.4)
Cholesterol, Total: 267 mg/dL — ABNORMAL HIGH (ref 100–199)
HDL: 64 mg/dL (ref >39)
LDL Calculated: 183 mg/dL — ABNORMAL HIGH (ref 0–99)
Triglycerides: 100 mg/dL (ref 0–149)
VLDL CHOLESTEROL CAL: 20 mg/dL (ref 5–40)

## 2014-08-24 LAB — HIV ANTIBODY (ROUTINE TESTING W REFLEX): HIV Screen 4th Generation wRfx: NONREACTIVE

## 2014-08-24 LAB — TSH: TSH: 2 u[IU]/mL (ref 0.450–4.500)

## 2014-08-25 LAB — CYTOLOGY - PAP

## 2014-08-28 ENCOUNTER — Telehealth: Payer: Self-pay | Admitting: Adult Health

## 2014-08-28 NOTE — Telephone Encounter (Signed)
Left message to call back about labs

## 2014-08-28 NOTE — Telephone Encounter (Signed)
Pt aware of labs and pap and need to watch diet and exercise

## 2015-09-18 ENCOUNTER — Ambulatory Visit: Payer: Medicaid - Out of State | Attending: Internal Medicine

## 2015-11-29 ENCOUNTER — Encounter (HOSPITAL_COMMUNITY): Payer: Self-pay | Admitting: *Deleted

## 2015-11-29 ENCOUNTER — Ambulatory Visit (HOSPITAL_COMMUNITY)
Admission: EM | Admit: 2015-11-29 | Discharge: 2015-11-29 | Disposition: A | Discharge status: Home / Self Care | Payer: Medicaid - Out of State | Attending: Emergency Medicine | Admitting: Emergency Medicine

## 2015-11-29 DIAGNOSIS — K2970 Gastritis, unspecified, without bleeding: Secondary | ICD-10-CM

## 2015-11-29 DIAGNOSIS — Z8619 Personal history of other infectious and parasitic diseases: Secondary | ICD-10-CM

## 2015-11-29 MED ORDER — AMOXICILLIN 500 MG PO TABS: 1000.0000 mg | ORAL_TABLET | Freq: Two times a day (BID) | ORAL | Status: DC

## 2015-11-29 MED ORDER — HYDROCHLOROTHIAZIDE 12.5 MG PO TABS: 12.5000 mg | ORAL_TABLET | Freq: Every day | ORAL | Status: DC

## 2015-11-29 MED ORDER — CLARITHROMYCIN 500 MG PO TABS: 500.0000 mg | ORAL_TABLET | Freq: Two times a day (BID) | ORAL | Status: DC

## 2015-11-29 MED ORDER — AMLODIPINE BESYLATE 5 MG PO TABS: 5.0000 mg | ORAL_TABLET | Freq: Every day | ORAL | Status: DC

## 2015-11-29 MED FILL — ?HYDROCHLOROTHIAZIDE 12.5MG: 12.5 | 30 days supply | Qty: 30 | Fill #0

## 2015-11-29 MED FILL — ?AMLODIPINE BESYLATE 5 MG T: 5 | 30 days supply | Qty: 30 | Fill #0

## 2015-11-29 MED FILL — ?CLARITHROMYCIN 500 MG TAB: 500 | 14 days supply | Qty: 28 | Fill #0

## 2015-11-29 MED FILL — AMOXICILLIN 500 MG CAPSULE: 500 | 14 days supply | Qty: 56 | Fill #0

## 2015-11-29 NOTE — ED Provider Notes (Signed)
CSN: [id number]     Arrival date & time 11/29/15  1305 History   First MD Initiated Contact with Patient 11/29/15 1334     Chief Complaint  Patient presents with  . Nausea   (Consider location/radiation/quality/duration/timing/severity/associated sxs/prior Treatment) HPI  She is a 50 year old woman here for evaluation of stomach issues. She states for the last 3-4 days she has had increased indigestion, gas, burping. She also reports some nausea, but no vomiting. She has had intermittent diarrhea without blood. She states she has a history of H. pylori infection in this feels exactly the same. No fevers. No chest pain or shortness of breath. She does report a cough. She states her symptoms are worse in the morning. She describes a feeling of reflux throughout her chest when she wakes up. Burping will temporarily relieve some of her symptoms.  Past Medical History  Diagnosis Date  . Hypertension   . Obesity   . Peri-menopause 08/23/2014  . Screening for STD (sexually transmitted disease) 08/23/2014   Past Surgical History  Procedure Laterality Date  . Cesarean section    . Breast reduction surgery    . Cholecystectomy    . Tubal ligation     Family History  Problem Relation Age of Onset  . Diabetes Other     maternal nephew  . Hypertension Mother   . Diabetes Mother   . Arthritis Mother     rheumatoid  . Hyperlipidemia Mother   . Hypertension Sister   . Diabetes Sister   . Other Brother     hunting accident  . Other Daughter     crohn's disease  . Hypertension Sister   . Diabetes Sister   . Hypertension Sister   . Hypertension Sister   . Hypertension Sister   . Lupus Sister   . Other Brother     fell off building  . Diabetes Brother   . Hypertension Brother   . Hypertension Brother    Social History  Substance Use Topics  . Smoking status: Never Smoker   . Smokeless tobacco: Never Used  . Alcohol Use: No   OB History    Gravida Para Term Preterm AB TAB SAB  Ectopic Multiple Living   2 2 2       2      Review of Systems As in history of present illness Allergies  Bactrim  Home Medications   Prior to Admission medications   Medication Sig Start Date End Date Taking? Authorizing Provider  acetaminophen (TYLENOL) 500 MG tablet Take 1,000 mg by mouth 2 (two) times daily as needed for headache.    Historical Provider, MD  amLODipine (NORVASC) 5 MG tablet Take 1 tablet (5 mg total) by mouth daily. 11/29/15   Melony Overly, MD  amoxicillin (AMOXIL) 500 MG tablet Take 2 tablets (1,000 mg total) by mouth 2 (two) times daily. 11/29/15   Melony Overly, MD  Ascorbic Acid (VITAMIN C PO) Take 1 tablet by mouth daily.    Historical Provider, MD  cholecalciferol (VITAMIN D) 1000 UNITS tablet Take 1,000 Units by mouth daily.    Historical Provider, MD  clarithromycin (BIAXIN) 500 MG tablet Take 1 tablet (500 mg total) by mouth 2 (two) times daily. 11/29/15   Melony Overly, MD  GARLIC PO Take 30 mg by mouth daily.    Historical Provider, MD  Ginkgo Biloba (GNP GINGKO BILOBA EXTRACT PO) Take 1 capsule by mouth daily.    Historical Provider, MD  hydrochlorothiazide (HYDRODIURIL)  12.5 MG tablet Take 1 tablet (12.5 mg total) by mouth daily. 11/29/15   Melony Overly, MD  ibuprofen (ADVIL,MOTRIN) 200 MG tablet Take 400 mg by mouth daily as needed for headache or moderate pain.     Historical Provider, MD  lisinopril (PRINIVIL,ZESTRIL) 10 MG tablet Take 1 tablet (10 mg total) by mouth daily. 01/13/14   Cleatrice Burke, PA-C  Omega-3 Fatty Acids (OMEGA 3 PO) Take 1 capsule by mouth daily.    Historical Provider, MD  pantoprazole (PROTONIX) 40 MG tablet Take 1 tablet (40 mg total) by mouth 2 (two) times daily. 08/19/14   Milton Ferguson, MD  Probiotic Product (PROBIOTIC DAILY PO) Take 1 tablet by mouth daily.    Historical Provider, MD  vitamin E 100 UNIT capsule Take 100 Units by mouth daily.    Historical Provider, MD   Meds Ordered and Administered this Visit  Medications - No data  to display  BP 144/75 mmHg  Pulse 72  Temp(Src) 98.8 F (37.1 C) (Oral)  Resp 18  SpO2 99% No data found.   Physical Exam  Constitutional: She is oriented to person, place, and time. She appears well-developed and well-nourished.  Neck: Neck supple.  Cardiovascular: Normal rate, regular rhythm and normal heart sounds.   No murmur heard. Pulmonary/Chest: Effort normal and breath sounds normal. No respiratory distress. She has no wheezes. She has no rales.  Abdominal: Soft. Bowel sounds are normal. She exhibits no distension and no mass. There is no tenderness. There is no rebound and no guarding.  Neurological: She is alert and oriented to person, place, and time.    ED Course  Procedures (including critical care time)  Labs Review Labs Reviewed - No data to display  Imaging Review No results found.    MDM   1. Gastritis   2. History of Helicobacter pylori infection    Discussed with patient that with a history of H. pylori infection, the blood test we have here is not beneficial. We will go ahead and retreat with amoxicillin and clarithromycin. She is are on a PPI. If her symptoms do not resolve with treatment, she will need to follow-up with her PCP for additional evaluation. She also requests a refill of her blood pressure medications. She states she is on amlodipine and HCTZ. She has been out of these for approximately 3 weeks. I did provide a two-month supply.    Melony Overly, MD 11/29/15 1409

## 2015-11-29 NOTE — ED Notes (Signed)
Pt  Reports   Symptoms  Of  Nausea      /   Diarrhea        X   3   Days       Pt  States    Was   Advised  To  Come  Here  And  Get  Tested  For  h  pylora

## 2015-11-29 NOTE — Discharge Instructions (Signed)
We are going to retreat you for H. pylori infection. Continue your acid blocker medicine. Take amoxicillin and clarithromycin as prescribed. If this does not resolve your symptoms, please follow-up with your primary care doctor for additional evaluation.  I have also provided a two-month supply of your blood pressure medicines.

## 2017-07-15 DIAGNOSIS — K219 Gastro-esophageal reflux disease without esophagitis: Secondary | ICD-10-CM | POA: Insufficient documentation

## 2018-05-03 ENCOUNTER — Ambulatory Visit: Payer: Self-pay | Attending: Family Medicine

## 2018-05-13 ENCOUNTER — Ambulatory Visit: Payer: Self-pay | Admitting: Family Medicine

## 2018-05-26 ENCOUNTER — Ambulatory Visit: Payer: Self-pay | Attending: Family Medicine | Admitting: Nurse Practitioner

## 2018-05-26 ENCOUNTER — Encounter: Payer: Self-pay | Admitting: Nurse Practitioner

## 2018-05-26 VITALS — BP 145/80 | HR 77 | Temp 98.6°F | Ht 66.0 in | Wt 330.6 lb

## 2018-05-26 DIAGNOSIS — Z9889 Other specified postprocedural states: Secondary | ICD-10-CM | POA: Insufficient documentation

## 2018-05-26 DIAGNOSIS — F331 Major depressive disorder, recurrent, moderate: Secondary | ICD-10-CM

## 2018-05-26 DIAGNOSIS — F339 Major depressive disorder, recurrent, unspecified: Secondary | ICD-10-CM | POA: Insufficient documentation

## 2018-05-26 DIAGNOSIS — Z9049 Acquired absence of other specified parts of digestive tract: Secondary | ICD-10-CM | POA: Insufficient documentation

## 2018-05-26 DIAGNOSIS — I10 Essential (primary) hypertension: Secondary | ICD-10-CM | POA: Insufficient documentation

## 2018-05-26 DIAGNOSIS — K219 Gastro-esophageal reflux disease without esophagitis: Secondary | ICD-10-CM | POA: Insufficient documentation

## 2018-05-26 DIAGNOSIS — Z9851 Tubal ligation status: Secondary | ICD-10-CM | POA: Insufficient documentation

## 2018-05-26 MED ORDER — HYDROCHLOROTHIAZIDE 25 MG PO TABS: 25.0000 mg | ORAL_TABLET | Freq: Every day | ORAL | 3 refills | Status: DC

## 2018-05-26 MED ORDER — AMLODIPINE BESYLATE 5 MG PO TABS: 5.0000 mg | ORAL_TABLET | Freq: Every day | ORAL | 3 refills | Status: DC

## 2018-05-26 MED ORDER — OMEPRAZOLE 40 MG PO CPDR: 40.0000 mg | DELAYED_RELEASE_CAPSULE | Freq: Every day | ORAL | 3 refills | Status: DC

## 2018-05-26 MED ORDER — CITALOPRAM HYDROBROMIDE 20 MG PO TABS: 20.0000 mg | ORAL_TABLET | Freq: Every day | ORAL | 3 refills | Status: DC

## 2018-05-26 MED FILL — CITALOPRAM HBR 20 MG TABLET: 20 | 30 days supply | Qty: 30 | Fill #0

## 2018-05-26 MED FILL — OMEPRAZOLE DR 40 MG CAPSULE: 40 | 30 days supply | Qty: 30 | Fill #0

## 2018-05-26 MED FILL — AMLODIPINE BESYLATE 5 MG TA: 5 | 30 days supply | Qty: 30 | Fill #0

## 2018-05-26 NOTE — Progress Notes (Signed)
Assessment & Plan:  Sheila Yoder was seen today for establish care.  Diagnoses and all orders for this visit:  Essential hypertension -     amLODipine (NORVASC) 5 MG tablet; Take 1 tablet (5 mg total) by mouth daily. HCTZ was not started as patient declined BMP due to [facility] costs. Continue all antihypertensives as prescribed.  Remember to bring in your blood pressure log with you for your follow up appointment.  DASH/Mediterranean Diets are healthier choices for HTN.   Gastroesophageal reflux disease, esophagitis presence not specified -     omeprazole (PRILOSEC) 40 MG capsule; Take 1 capsule (40 mg total) by mouth daily. INSTRUCTIONS: Avoid GERD Triggers: acidic, spicy or fried foods, caffeine, coffee, sodas,  alcohol and chocolate.  Her weight may also be contributing to her GERD symptoms.   Moderate episode of recurrent major depressive disorder (HCC) -     citalopram (CELEXA) 20 MG tablet; Take 1 tablet (20 mg total) by mouth daily.    Patient has been counseled on age-appropriate routine health concerns for screening and prevention. These are reviewed and up-to-date. Referrals have been placed accordingly. Immunizations are up-to-date or declined.    Subjective:   Chief Complaint  Patient presents with  . Establish Care    Pt. is here to establish care for hypertension.    HPI Sheila Yoder 52 y.o. female presents to office today to establish care. She has a history of recurrent Hpylori, GERD, Depression and HTN (although she reports she was told in the past that she did not require blood pressure medication because her top number was only slightly above the abnormal range).   Essential Hypertension Chronic. Poorly controlled.  I will start her on amlodipine 5 mg today. Initially I was going to start her on HCTZ however she declined a BMP today and a repeat BMP with BP recheck due to [facility] costs. She currently denies chest pain, shortness of breath, palpitations, lightheadedness,  dizziness, headaches or BLE edema.   BP Readings from Last 3 Encounters:  05/26/18 (!) 145/80  11/29/15 144/75  08/23/14 140/80    GERD Chornic and poorly controlled. She was being followed by GI with plans for HIDA scan until she lost her insurance. Patient has been advised to apply for financial assistance and schedule to see our financial counselor. Symptoms include  bilious reflux, heartburn, midespigastric pain, nausea and sore throat.  She denies melena and regurgitation of undigested food. Symptoms have been present for a few years. She denies dysphagia.  She has not lost weight. She denies melena, hematochezia, hematemesis, and coffee ground emesis. Medical therapy in the past has included proton pump inhibitors as well as recurrent treatment for Hpylori.    Depression Chronic and well controlled with Celexa 20mg  daily. She denies any current thoughts of suicidal ideation.  Depression screen PHQ 2/9 05/26/2018  Decreased Interest 2  Down, Depressed, Hopeless 2  PHQ - 2 Score 4  Altered sleeping 2  Tired, decreased energy 1  Change in appetite 0  Feeling bad or failure about yourself  1  Trouble concentrating 2  Moving slowly or fidgety/restless 2  Suicidal thoughts 0  PHQ-9 Score 12    Depression She has taken  Review of Systems  Constitutional: Negative for fever, malaise/fatigue and weight loss.  HENT: Negative.  Negative for nosebleeds.   Eyes: Negative.  Negative for blurred vision, double vision and photophobia.  Respiratory: Negative.  Negative for cough and shortness of breath.   Cardiovascular: Negative.  Negative  for chest pain, palpitations and leg swelling.  Gastrointestinal: Positive for heartburn. Negative for nausea and vomiting.  Musculoskeletal: Negative.  Negative for myalgias.  Neurological: Negative.  Negative for dizziness, focal weakness, seizures and headaches.  Psychiatric/Behavioral: Positive for depression. Negative for suicidal ideas.    Past  Medical History:  Diagnosis Date  . Hypertension   . Obesity   . Peri-menopause 08/23/2014  . Screening for STD (sexually transmitted disease) 08/23/2014    Past Surgical History:  Procedure Laterality Date  . BREAST REDUCTION SURGERY    . CESAREAN SECTION    . CHOLECYSTECTOMY    . TUBAL LIGATION      Family History  Problem Relation Age of Onset  . Diabetes Other        maternal nephew  . Hypertension Mother   . Diabetes Mother   . Arthritis Mother        rheumatoid  . Hyperlipidemia Mother   . Hypertension Sister   . Diabetes Sister   . Other Brother        hunting accident  . Other Daughter        crohn's disease  . Hypertension Sister   . Diabetes Sister   . Hypertension Sister   . Hypertension Sister   . Hypertension Sister   . Lupus Sister   . Other Brother        fell off building  . Diabetes Brother   . Hypertension Brother   . Hypertension Brother     Social History Reviewed with no changes to be made today.   Outpatient Medications Prior to Visit  Medication Sig Dispense Refill  . Ascorbic Acid (VITAMIN C PO) Take 1 tablet by mouth daily.    . cholecalciferol (VITAMIN D) 1000 UNITS tablet Take 1,000 Units by mouth daily.    . Omega-3 Fatty Acids (OMEGA 3 PO) Take 1 capsule by mouth daily.    . pantoprazole (PROTONIX) 40 MG tablet Take 1 tablet (40 mg total) by mouth 2 (two) times daily. 28 tablet 0  . ibuprofen (ADVIL,MOTRIN) 200 MG tablet Take 400 mg by mouth daily as needed for headache or moderate pain.     Marland Kitchen acetaminophen (TYLENOL) 500 MG tablet Take 1,000 mg by mouth 2 (two) times daily as needed for headache.    Marland Kitchen amoxicillin (AMOXIL) 500 MG tablet Take 2 tablets (1,000 mg total) by mouth 2 (two) times daily. 56 tablet 0  . clarithromycin (BIAXIN) 500 MG tablet Take 1 tablet (500 mg total) by mouth 2 (two) times daily. 28 tablet 0  . GARLIC PO Take 30 mg by mouth daily.    . Ginkgo Biloba (GNP GINGKO BILOBA EXTRACT PO) Take 1 capsule by mouth  daily.    Marland Kitchen lisinopril (PRINIVIL,ZESTRIL) 10 MG tablet Take 1 tablet (10 mg total) by mouth daily. (Patient not taking: Reported on 05/26/2018) 30 tablet 0  . Probiotic Product (PROBIOTIC DAILY PO) Take 1 tablet by mouth daily.    . vitamin E 100 UNIT capsule Take 100 Units by mouth daily.    Marland Kitchen amLODipine (NORVASC) 5 MG tablet Take 1 tablet (5 mg total) by mouth daily. (Patient not taking: Reported on 05/26/2018) 30 tablet 1  . hydrochlorothiazide (HYDRODIURIL) 12.5 MG tablet Take 1 tablet (12.5 mg total) by mouth daily. (Patient not taking: Reported on 05/26/2018) 30 tablet 1   No facility-administered medications prior to visit.     Allergies  Allergen Reactions  . Bactrim [Sulfamethoxazole-Trimethoprim] Other (See Comments)  Chest pain       Objective:    BP (!) 145/80 (BP Location: Left Arm, Patient Position: Sitting, Cuff Size: Large) Comment (Cuff Size): thigh  Pulse 77   Temp 98.6 F (37 C) (Oral)   Ht 5\' 6"  (1.676 m)   Wt (!) 330 lb 9.6 oz (150 kg)   SpO2 97%   BMI 53.36 kg/m  Wt Readings from Last 3 Encounters:  05/26/18 (!) 330 lb 9.6 oz (150 kg)  08/23/14 (!) 318 lb (144.2 kg)  08/19/14 275 lb (124.7 kg)    Physical Exam  Constitutional: She is oriented to person, place, and time. She appears well-developed and well-nourished. She is cooperative.  HENT:  Head: Normocephalic and atraumatic.  Eyes: EOM are normal.  Neck: Normal range of motion.  Cardiovascular: Normal rate, regular rhythm and normal heart sounds. Exam reveals no gallop and no friction rub.  No murmur heard. Pulmonary/Chest: Effort normal and breath sounds normal. No tachypnea. No respiratory distress. She has no decreased breath sounds. She has no wheezes. She has no rhonchi. She has no rales. She exhibits no tenderness.  Abdominal: Bowel sounds are normal.  Musculoskeletal: Normal range of motion. She exhibits no edema.  Neurological: She is alert and oriented to person, place, and time.  Coordination normal.  Skin: Skin is warm and dry.  Psychiatric: She has a normal mood and affect. Her behavior is normal. Judgment and thought content normal.  Nursing note and vitals reviewed.      Patient has been counseled extensively about nutrition and exercise as well as the importance of adherence with medications and regular follow-up. The patient was given clear instructions to go to ER or return to medical center if symptoms don't improve, worsen or new problems develop. The patient verbalized understanding.   Follow-up: Return in about 26 days (around 06/21/2018) for BP RECHECK, Needs appointment with financial representative.Gildardo Pounds, FNP-BC [facility] and [facility] [place], [place]   05/26/2018, 1:01 PM

## 2018-05-26 NOTE — Patient Instructions (Signed)
Food Choices for Gastroesophageal Reflux Disease, Adult When you have gastroesophageal reflux disease (GERD), the foods you eat and your eating habits are very important. Choosing the right foods can help ease your discomfort. What guidelines do I need to follow?  Choose fruits, vegetables, whole grains, and low-fat dairy products.  Choose low-fat meat, fish, and poultry.  Limit fats such as oils, salad dressings, butter, nuts, and avocado.  Keep a food diary. This helps you identify foods that cause symptoms.  Avoid foods that cause symptoms. These may be different for everyone.  Eat small meals often instead of 3 large meals a day.  Eat your meals slowly, in a place where you are relaxed.  Limit fried foods.  Cook foods using methods other than frying.  Avoid drinking alcohol.  Avoid drinking large amounts of liquids with your meals.  Avoid bending over or lying down until 2-3 hours after eating. What foods are not recommended? These are some foods and drinks that may make your symptoms worse: Vegetables Tomatoes. Tomato juice. Tomato and spaghetti sauce. Chili peppers. Onion and garlic. Horseradish. Fruits Oranges, grapefruit, and lemon (fruit and juice). Meats High-fat meats, fish, and poultry. This includes hot dogs, ribs, ham, sausage, salami, and bacon. Dairy Whole milk and chocolate milk. Sour cream. Cream. Butter. Ice cream. Cream cheese. Drinks Coffee and tea. Bubbly (carbonated) drinks or energy drinks. Condiments Hot sauce. Barbecue sauce. Sweets/Desserts Chocolate and cocoa. Donuts. Peppermint and spearmint. Fats and Oils High-fat foods. This includes French fries and potato chips. Other Vinegar. Strong spices. This includes black pepper, white pepper, red pepper, cayenne, curry powder, cloves, ginger, and chili powder. The items listed above may not be a complete list of foods and drinks to avoid. Contact your dietitian for more information. This  information is not intended to replace advice given to you by your health care provider. Make sure you discuss any questions you have with your health care provider. Document Released: 12/16/2011 Document Revised: 11/22/2015 Document Reviewed: 04/20/2013 Elsevier Interactive Patient Education  2017 Elsevier Inc.  Gastroesophageal Reflux Disease, Adult Normally, food travels down the esophagus and stays in the stomach to be digested. If a person has gastroesophageal reflux disease (GERD), food and stomach acid move back up into the esophagus. When this happens, the esophagus becomes sore and swollen (inflamed). Over time, GERD can make small holes (ulcers) in the lining of the esophagus. Follow these instructions at home: Diet  Follow a diet as told by your doctor. You may need to avoid foods and drinks such as: ? Coffee and tea (with or without caffeine). ? Drinks that contain alcohol. ? Energy drinks and sports drinks. ? Carbonated drinks or sodas. ? Chocolate and cocoa. ? Peppermint and mint flavorings. ? Garlic and onions. ? Horseradish. ? Spicy and acidic foods, such as peppers, chili powder, curry powder, vinegar, hot sauces, and BBQ sauce. ? Citrus fruit juices and citrus fruits, such as oranges, lemons, and limes. ? Tomato-based foods, such as red sauce, chili, salsa, and pizza with red sauce. ? Fried and fatty foods, such as donuts, french fries, potato chips, and high-fat dressings. ? High-fat meats, such as hot dogs, rib eye steak, sausage, ham, and bacon. ? High-fat dairy items, such as whole milk, butter, and cream cheese.  Eat small meals often. Avoid eating large meals.  Avoid drinking large amounts of liquid with your meals.  Avoid eating meals during the 2-3 hours before bedtime.  Avoid lying down right after you eat.  Do   not exercise right after you eat. General instructions  Pay attention to any changes in your symptoms.  Take over-the-counter and prescription  medicines only as told by your doctor. Do not take aspirin, ibuprofen, or other NSAIDs unless your doctor says it is okay.  Do not use any tobacco products, including cigarettes, chewing tobacco, and e-cigarettes. If you need help quitting, ask your doctor.  Wear loose clothes. Do not wear anything tight around your waist.  Raise (elevate) the head of your bed about 6 inches (15 cm).  Try to lower your stress. If you need help doing this, ask your doctor.  If you are overweight, lose an amount of weight that is healthy for you. Ask your doctor about a safe weight loss goal.  Keep all follow-up visits as told by your doctor. This is important. Contact a doctor if:  You have new symptoms.  You lose weight and you do not know why it is happening.  You have trouble swallowing, or it hurts to swallow.  You have wheezing or a cough that keeps happening.  Your symptoms do not get better with treatment.  You have a hoarse voice. Get help right away if:  You have pain in your arms, neck, jaw, teeth, or back.  You feel sweaty, dizzy, or light-headed.  You have chest pain or shortness of breath.  You throw up (vomit) and your throw up looks like blood or coffee grounds.  You pass out (faint).  Your poop (stool) is bloody or black.  You cannot swallow, drink, or eat. This information is not intended to replace advice given to you by your health care provider. Make sure you discuss any questions you have with your health care provider. Document Released: 12/03/2007 Document Revised: 11/22/2015 Document Reviewed: 10/11/2014 Elsevier Interactive Patient Education  2018 Elsevier Inc.  

## 2018-06-07 MED FILL — AMLODIPINE BESYLATE 5 MG TA: 5 | 30 days supply | Qty: 30 | Fill #0

## 2018-06-15 ENCOUNTER — Ambulatory Visit: Payer: Medicaid Other

## 2018-07-07 ENCOUNTER — Ambulatory Visit: Payer: Medicaid Other | Admitting: Nurse Practitioner

## 2018-07-28 ENCOUNTER — Ambulatory Visit: Payer: Self-pay | Attending: Family Medicine | Admitting: Physician Assistant

## 2018-07-28 VITALS — BP 124/86 | HR 82 | Temp 98.5°F | Resp 16

## 2018-07-28 DIAGNOSIS — R109 Unspecified abdominal pain: Secondary | ICD-10-CM

## 2018-07-28 DIAGNOSIS — Z23 Encounter for immunization: Secondary | ICD-10-CM

## 2018-07-28 DIAGNOSIS — Z1211 Encounter for screening for malignant neoplasm of colon: Secondary | ICD-10-CM

## 2018-07-28 MED FILL — AMLODIPINE BESYLATE 5 MG TA: 5 | 30 days supply | Qty: 30 | Fill #1

## 2018-07-28 NOTE — Progress Notes (Signed)
Would like a referral to a GI specialist.

## 2018-07-28 NOTE — Progress Notes (Signed)
Patient ID: Sheila Yoder, female   DOB: 05/25/1966, 53 y.o.   MRN: [id number]     Sheila Yoder, is a 53 y.o. female  [id number]:[id number]  [id number]  DOB - August 20, 1965  Subjective:  Chief Complaint and HPI: Sheila Yoder is a 53 y.o. female here today for chronic and ongoing Cramping, diarrhea(loose stools), h/o h. Pylori.  +lots of stomach gas.  Appetite is good.  + frequent nausea.  No vomiting.  No weight loss.  No melena/hematochezia.  She says she saw a GI doc several years ago and had esophageal stricture.  She has been feeling like lately that certain foods get "stuck."    FH:  Daughter has Crohn's  ROS:   Constitutional:  No f/c, No night sweats, No unexplained weight loss. EENT:  No vision changes, No blurry vision, No hearing changes. No mouth, throat, or ear problems.  Respiratory: No cough, No SOB Cardiac: No CP, no palpitations GI:  + abd pain, + N no V/D. GU: No Urinary s/sx Musculoskeletal: No joint pain Neuro: No headache, no dizziness, no motor weakness.  Skin: No rash Endocrine:  No polydipsia. No polyuria.  Psych: Denies SI/HI  No problems updated.  ALLERGIES: Allergies  Allergen Reactions  . Bactrim [Sulfamethoxazole-Trimethoprim] Other (See Comments)    Chest pain    PAST MEDICAL HISTORY: Past Medical History:  Diagnosis Date  . Hypertension   . Obesity   . Peri-menopause 08/23/2014  . Screening for STD (sexually transmitted disease) 08/23/2014    MEDICATIONS AT HOME: Prior to Admission medications   Medication Sig Start Date End Date Taking? Authorizing Provider  acetaminophen (TYLENOL) 500 MG tablet Take 1,000 mg by mouth 2 (two) times daily as needed for headache.   Yes [provider]  amLODipine (NORVASC) 5 MG tablet Take 1 tablet (5 mg total) by mouth daily. 05/26/18  Yes Gildardo Pounds, NP  cholecalciferol (VITAMIN D) 1000 UNITS tablet Take 1,000 Units by mouth daily.   Yes [provider]  Omega-3 Fatty Acids (OMEGA 3 PO) Take  1 capsule by mouth daily.   Yes [provider]  citalopram (CELEXA) 20 MG tablet Take 1 tablet (20 mg total) by mouth daily. Patient not taking: Reported on 07/28/2018 05/26/18   Gildardo Pounds, NP  clarithromycin (BIAXIN) 500 MG tablet Take 1 tablet (500 mg total) by mouth 2 (two) times daily. Patient not taking: Reported on 07/28/2018 11/29/15   Melony Overly, MD  GARLIC PO Take 30 mg by mouth daily.    [provider]  Ginkgo Biloba (GNP GINGKO BILOBA EXTRACT PO) Take 1 capsule by mouth daily.    [provider]  lisinopril (PRINIVIL,ZESTRIL) 10 MG tablet Take 1 tablet (10 mg total) by mouth daily. Patient not taking: Reported on 05/26/2018 01/13/14   Cleatrice Burke, PA-C  omeprazole (PRILOSEC) 40 MG capsule Take 1 capsule (40 mg total) by mouth daily. Patient not taking: Reported on 07/28/2018 05/26/18   Gildardo Pounds, NP  pantoprazole (PROTONIX) 40 MG tablet Take 1 tablet (40 mg total) by mouth 2 (two) times daily. Patient not taking: Reported on 07/28/2018 08/19/14   Milton Ferguson, MD  Probiotic Product (PROBIOTIC DAILY PO) Take 1 tablet by mouth daily.    [provider]  vitamin E 100 UNIT capsule Take 100 Units by mouth daily.    [provider]     Objective:  EXAM:   Vitals:   07/28/18 0941  BP: 124/86  Pulse: 82  Resp: 16  Temp: 98.5 F (36.9 C)  TempSrc: Oral  SpO2: 98%    General appearance : A&OX3. NAD. Non-toxic-appearing, morbidly obese HEENT: Atraumatic and Normocephalic.  PERRLA. EOM intact.  Neck: supple, no JVD. No cervical lymphadenopathy. No thyromegaly Chest/Lungs:  Breathing-non-labored, Good air entry bilaterally, breath sounds normal without rales, rhonchi, or wheezing  CVS: S1 S2 regular, no murmurs, gallops, rubs  Abdomen: Bowel sounds present, Non tender and not distended with no gaurding, rigidity or rebound. Extremities: Bilateral Lower Ext shows no edema, both legs are warm to touch with = pulse  throughout Neurology:  CN II-XII grossly intact, Non focal.   Psych:  TP linear. J/I WNL. Normal speech. Appropriate eye contact and affect.  Skin:  No Rash  Data Review No results found for: HGBA1C   Assessment & Plan   1. Abdominal cramping-chronic/nothing acute - H. pylori breath test - Comprehensive metabolic panel - CBC with Differential/Platelet - Ambulatory referral to Gastroenterology  2. Screening for colon cancer H/o esophageal stricture and due colonoscopy - Ambulatory referral to Gastroenterology   Patient have been counseled extensively about nutrition and exercise  Return for for 2/12 appt with Zelda.  The patient was given clear instructions to go to ER or return to medical center if symptoms don't improve, worsen or new problems develop. The patient verbalized understanding. The patient was told to call to get lab results if they haven't heard anything in the next week.     Freeman Caldron, PA-C [facility] and [facility] [place], [place]   07/28/2018, 10:03 AM

## 2018-07-29 LAB — COMPREHENSIVE METABOLIC PANEL
A/G RATIO: 1.6 (ref 1.2–2.2)
ALT: 17 IU/L (ref 0–32)
AST: 14 IU/L (ref 0–40)
Albumin: 4.4 g/dL (ref 3.8–4.8)
Alkaline Phosphatase: 65 IU/L (ref 39–117)
BUN/Creatinine Ratio: 15 (ref 9–23)
BUN: 12 mg/dL (ref 6–24)
Bilirubin Total: 0.4 mg/dL (ref 0.0–1.2)
CO2: 24 mmol/L (ref 20–29)
Calcium: 9.4 mg/dL (ref 8.7–10.2)
Chloride: 101 mmol/L (ref 96–106)
Creatinine, Ser: 0.78 mg/dL (ref 0.57–1.00)
GFR calc Af Amer: 103 mL/min/{1.73_m2} (ref >59)
GFR calc non Af Amer: 89 mL/min/{1.73_m2} (ref >59)
GLOBULIN, TOTAL: 2.7 g/dL (ref 1.5–4.5)
Glucose: 85 mg/dL (ref 65–99)
POTASSIUM: 4.2 mmol/L (ref 3.5–5.2)
SODIUM: 140 mmol/L (ref 134–144)
Total Protein: 7.1 g/dL (ref 6.0–8.5)

## 2018-07-29 LAB — CBC WITH DIFFERENTIAL/PLATELET
BASOS: 1 %
Basophils Absolute: 0 10*3/uL (ref 0.0–0.2)
EOS (ABSOLUTE): 0 10*3/uL (ref 0.0–0.4)
Eos: 1 %
Hematocrit: 33.2 % — ABNORMAL LOW (ref 34.0–46.6)
Hemoglobin: 11.4 g/dL (ref 11.1–15.9)
Immature Grans (Abs): 0 10*3/uL (ref 0.0–0.1)
Immature Granulocytes: 0 %
Lymphocytes Absolute: 2.2 10*3/uL (ref 0.7–3.1)
Lymphs: 41 %
MCH: 30.6 pg (ref 26.6–33.0)
MCHC: 34.3 g/dL (ref 31.5–35.7)
MCV: 89 fL (ref 79–97)
MONOS ABS: 0.4 10*3/uL (ref 0.1–0.9)
Monocytes: 8 %
Neutrophils Absolute: 2.7 10*3/uL (ref 1.4–7.0)
Neutrophils: 49 %
Platelets: 300 10*3/uL (ref 150–450)
RBC: 3.72 x10E6/uL — AB (ref 3.77–5.28)
RDW: 12.7 % (ref 11.7–15.4)
WBC: 5.3 10*3/uL (ref 3.4–10.8)

## 2018-07-30 ENCOUNTER — Ambulatory Visit: Payer: Self-pay | Attending: Family Medicine

## 2018-07-30 ENCOUNTER — Other Ambulatory Visit: Payer: Self-pay | Admitting: Physician Assistant

## 2018-07-30 DIAGNOSIS — K219 Gastro-esophageal reflux disease without esophagitis: Secondary | ICD-10-CM

## 2018-07-30 LAB — H. PYLORI BREATH TEST: H pylori Breath Test: POSITIVE — AB

## 2018-07-30 MED ORDER — OMEPRAZOLE 40 MG PO CPDR: 40.0000 mg | DELAYED_RELEASE_CAPSULE | Freq: Every day | ORAL | 3 refills | Status: DC

## 2018-07-30 MED ORDER — CLARITHROMYCIN 500 MG PO TABS: 500.0000 mg | ORAL_TABLET | Freq: Two times a day (BID) | ORAL | 0 refills | Status: DC

## 2018-07-30 MED ORDER — AMOXICILLIN 500 MG PO CAPS: 1000.0000 mg | ORAL_CAPSULE | Freq: Two times a day (BID) | ORAL | 0 refills | Status: DC

## 2018-07-30 MED FILL — AMOXICILLIN 500 MG CAPSULE: 500 | 14 days supply | Qty: 54 | Fill #0

## 2018-07-30 MED FILL — OMEPRAZOLE DR 40 MG CAPSULE: 40 | 30 days supply | Qty: 30 | Fill #0

## 2018-07-30 MED FILL — CLARITHROMYCIN 500 MG TAB: 500 | 14 days supply | Qty: 28 | Fill #0

## 2018-08-02 ENCOUNTER — Telehealth: Payer: Self-pay

## 2018-08-02 NOTE — Telephone Encounter (Signed)
Contacted pt to go over lab results pt was unavailable but she will call back

## 2018-08-06 ENCOUNTER — Telehealth: Payer: Self-pay | Admitting: Nurse Practitioner

## 2018-08-06 NOTE — Telephone Encounter (Signed)
I LVM to Pt informed her that the letter she submit is only that she is living with her son but NOT that is financially supporting her for these reason we need a new correct letter

## 2018-08-11 ENCOUNTER — Ambulatory Visit: Payer: Medicaid Other | Admitting: Nurse Practitioner

## 2018-08-12 ENCOUNTER — Telehealth: Payer: Self-pay | Admitting: Nurse Practitioner

## 2018-08-12 NOTE — Telephone Encounter (Signed)
Pt was mailed to [facility] and [place] today 08/12/2018

## 2018-08-16 ENCOUNTER — Ambulatory Visit: Payer: Medicaid Other | Admitting: Nurse Practitioner

## 2018-08-19 ENCOUNTER — Ambulatory Visit: Payer: Medicaid Other

## 2018-08-31 MED FILL — CLARITHROMYCIN 500 MG TAB: 500 | 14 days supply | Qty: 28 | Fill #0

## 2018-08-31 MED FILL — AMOXICILLIN 500 MG CAPSULE: 500 | 14 days supply | Qty: 54 | Fill #0

## 2018-12-29 MED FILL — ?OMEPRAZOLE 20 MG CAPSULE D: 20 | 30 days supply | Qty: 60 | Fill #0

## 2018-12-29 MED FILL — ?AMLODIPINE BESYLATE 5MG TA: 5 | 30 days supply | Qty: 30 | Fill #2

## 2019-01-05 ENCOUNTER — Ambulatory Visit: Payer: Self-pay | Attending: Nurse Practitioner | Admitting: Nurse Practitioner

## 2019-01-05 ENCOUNTER — Other Ambulatory Visit: Payer: Self-pay

## 2019-01-05 ENCOUNTER — Encounter: Payer: Self-pay | Admitting: Nurse Practitioner

## 2019-01-05 DIAGNOSIS — Z13220 Encounter for screening for lipoid disorders: Secondary | ICD-10-CM

## 2019-01-05 DIAGNOSIS — M722 Plantar fascial fibromatosis: Secondary | ICD-10-CM

## 2019-01-05 DIAGNOSIS — J302 Other seasonal allergic rhinitis: Secondary | ICD-10-CM

## 2019-01-05 DIAGNOSIS — K2100 Gastro-esophageal reflux disease with esophagitis, without bleeding: Secondary | ICD-10-CM

## 2019-01-05 DIAGNOSIS — Z1231 Encounter for screening mammogram for malignant neoplasm of breast: Secondary | ICD-10-CM

## 2019-01-05 DIAGNOSIS — M7751 Other enthesopathy of right foot: Secondary | ICD-10-CM

## 2019-01-05 DIAGNOSIS — Z1321 Encounter for screening for nutritional disorder: Secondary | ICD-10-CM

## 2019-01-05 DIAGNOSIS — N939 Abnormal uterine and vaginal bleeding, unspecified: Secondary | ICD-10-CM

## 2019-01-05 MED ORDER — PREDNISONE 20 MG PO TABS: 40.0000 mg | ORAL_TABLET | Freq: Every day | ORAL | 0 refills | Status: AC

## 2019-01-05 MED ORDER — CETIRIZINE HCL 10 MG PO TABS: 10.0000 mg | ORAL_TABLET | Freq: Every day | ORAL | 11 refills | Status: DC

## 2019-01-05 MED ORDER — ESOMEPRAZOLE MAGNESIUM 40 MG PO CPDR: 40.0000 mg | DELAYED_RELEASE_CAPSULE | Freq: Every day | ORAL | 0 refills | Status: DC

## 2019-01-05 MED FILL — ?CETIRIZINE HCL 10 MG TABLE: 10 | 30 days supply | Qty: 30 | Fill #0

## 2019-01-05 MED FILL — ?ESOMEPRAZOLE MAG DR 40MG C: 40 | 30 days supply | Qty: 30 | Fill #0

## 2019-01-05 NOTE — Progress Notes (Signed)
Virtual Visit via Telephone Note Due to national recommendations of social distancing due to [place], telehealth visit is felt to be most appropriate for this patient at this time.  I discussed the limitations, risks, security and privacy concerns of performing an evaluation and management service by telephone and the availability of in person appointments. I also discussed with the patient that there may be a patient responsible charge related to this service. The patient expressed understanding and agreed to proceed.    I connected with Sheila Yoder on 01/05/19  at   9:50 AM EDT  EDT by telephone and verified that I am speaking with the correct person using two identifiers.   Consent I discussed the limitations, risks, security and privacy concerns of performing an evaluation and management service by telephone and the availability of in person appointments. I also discussed with the patient that there may be a patient responsible charge related to this service. The patient expressed understanding and agreed to proceed.   Location of Patient: Private Residence   Location of Provider: [place] participating in Telemedicine visit: Geryl Rankins FNP-BC [place]    History of Present Illness: Telemedicine visit for: Follow up I have not seen Sheila Yoder in this office since 05-26-2018. She was seen by another provider here on 07-28-2018 for abdominal pain.  has a past medical history of Hypertension, Obesity, Peri-menopause (08/23/2014), and Screening for STD (sexually transmitted disease) (08/23/2014). She has complaints today of GERD, chronic right foot pain, AUB and allergic rhinitis today.    Right Foot Pain She has seen a podiatrist in the past and received injections for right foot  Plantar Fasciitis. She also has a history of Calcaneal bursitis and onychomycosis. States having difficulty weight bearing on right foot. Denies any  injury or trauma.   AUB Can not recall the exact date of her last menstrual cycle. States she thinks it was over a year ago but not quite sure. She has been spotting for the past 2 weeks. She does not have a history of abnormal PAPs and she is overdue for PAP smear by one year. She denies any abdominal pain, dysuria or GU symptoms.    GERD History of esophagitis, GERD, and esophageal dilatation. She states omeprazole is not working well for her. States nexxium works well for her unlike Financial [employer] or protonix.  She also has a history of recurrent H pylori and was prescribed triple therapy in January. Per pharmacy record she picked up the omeprazole in January but did not pick up the biaxin or amoxil until March. There is a high likelihood that her H pylori was not eradicated. She has seen a gastroenterologist in past. Will refer for follow up.     Past Medical History:  Diagnosis Date  . Hypertension   . Obesity   . Peri-menopause 08/23/2014  . Screening for STD (sexually transmitted disease) 08/23/2014    Past Surgical History:  Procedure Laterality Date  . BREAST REDUCTION SURGERY    . CESAREAN SECTION    . CHOLECYSTECTOMY    . TUBAL LIGATION      Family History  Problem Relation Age of Onset  . Diabetes Other        maternal nephew  . Hypertension Mother   . Diabetes Mother   . Arthritis Mother        rheumatoid  . Hyperlipidemia Mother   . Hypertension Sister   . Diabetes Sister   .  Other Brother        hunting accident  . Other Daughter        crohn's disease  . Hypertension Sister   . Diabetes Sister   . Hypertension Sister   . Hypertension Sister   . Hypertension Sister   . Lupus Sister   . Other Brother        fell off building  . Diabetes Brother   . Hypertension Brother   . Hypertension Brother     Social History   Socioeconomic History  . Marital status: Single    Spouse name: Not on file  . Number of children: Not on file  . Years of education: Not on  file  . Highest education level: Not on file  Occupational History  . Not on file  Social Needs  . Financial resource strain: Not on file  . Food insecurity    Worry: Not on file    Inability: Not on file  . Transportation needs    Medical: Not on file    Non-medical: Not on file  Tobacco Use  . Smoking status: Never Smoker  . Smokeless tobacco: Never Used  Substance and Sexual Activity  . Alcohol use: No  . Drug use: No  . Sexual activity: Not Currently    Birth control/protection: Surgical    Comment: tubal  Lifestyle  . Physical activity    Days per week: Not on file    Minutes per session: Not on file  . Stress: Not on file  Relationships  . Social Herbalist on phone: Not on file    Gets together: Not on file    Attends religious service: Not on file    Active member of club or organization: Not on file    Attends meetings of clubs or organizations: Not on file    Relationship status: Not on file  Other Topics Concern  . Not on file  Social History Narrative  . Not on file     Observations/Objective: Awake, alert and oriented x 3   Review of Systems  Constitutional: Negative for fever, malaise/fatigue and weight loss.  HENT: Negative.  Negative for nosebleeds.   Eyes: Negative.  Negative for blurred vision, double vision and photophobia.  Respiratory: Negative.  Negative for cough and shortness of breath.   Cardiovascular: Negative.  Negative for chest pain, palpitations and leg swelling.  Gastrointestinal: Positive for abdominal pain and heartburn. Negative for blood in stool, constipation, diarrhea, melena, nausea and vomiting.  Genitourinary:       SEE HPI  Musculoskeletal: Negative for myalgias.       SEE HPI  Neurological: Negative.  Negative for dizziness, focal weakness, seizures and headaches.  Endo/Heme/Allergies: Positive for environmental allergies.  Psychiatric/Behavioral: Negative.  Negative for suicidal ideas.    Assessment and  Plan: Sheila was seen today for foot pain.  Diagnoses and all orders for this visit:  Abnormal uterine bleeding (AUB) -     Ambulatory referral to Gynecology  Plantar fasciitis of right foot -     Ambulatory referral to Podiatry -     predniSONE (DELTASONE) 20 MG tablet; Take 2 tablets (40 mg total) by mouth daily with breakfast for 7 days.  Right calcaneal bursitis -     VITAMIN D 25 Hydroxy (Vit-D Deficiency, Fractures)  Gastroesophageal reflux disease with esophagitis -     Ambulatory referral to Gastroenterology -     Basic metabolic panel -  CBC -     esomeprazole (NEXIUM) 40 MG capsule; Take 1 capsule (40 mg total) by mouth daily at 12 noon. INSTRUCTIONS: Avoid GERD Triggers: acidic, spicy or fried foods, caffeine, coffee, sodas,  alcohol and chocolate.   Breast cancer screening by mammogram -     MM 3D SCREEN BREAST BILATERAL; Future  Encounter for vitamin deficiency screening -     VITAMIN D 25 Hydroxy (Vit-D Deficiency, Fractures)  Encounter for screening for lipid disorder -     Lipid panel  Seasonal allergies -     cetirizine (ZYRTEC) 10 MG tablet; Take 1 tablet (10 mg total) by mouth daily.     Follow Up Instructions Return in about 3 months (around 04/07/2019).     I discussed the assessment and treatment plan with the patient. The patient was provided an opportunity to ask questions and all were answered. The patient agreed with the plan and demonstrated an understanding of the instructions.   The patient was advised to call back or seek an in-person evaluation if the symptoms worsen or if the condition fails to improve as anticipated.  I provided 24 minutes of non-face-to-face time during this encounter including median intraservice time, reviewing previous notes, labs, imaging, medications and explaining diagnosis and management.  Gildardo Pounds, FNP-BC

## 2019-01-10 ENCOUNTER — Telehealth: Payer: Self-pay | Admitting: *Deleted

## 2019-01-10 NOTE — Telephone Encounter (Signed)
Patient verified DOB Patient complains of anxiety increasing and wanting to speak with a counselor.

## 2019-01-11 NOTE — Telephone Encounter (Signed)
CMA spoke to PCP. PCP inform she will need to speak to the social counselor regarding her anxiety.  CMA will route this message to [facility], Paramedic.

## 2019-01-17 ENCOUNTER — Ambulatory Visit: Payer: Medicaid Other

## 2019-01-25 ENCOUNTER — Telehealth: Payer: Self-pay | Admitting: Licensed Clinical Social Worker

## 2019-01-25 NOTE — Telephone Encounter (Signed)
Call placed to patient to follow up on consult to address anxiety. LCSW left message for a return call.

## 2019-01-27 ENCOUNTER — Other Ambulatory Visit: Payer: Self-pay

## 2019-01-27 ENCOUNTER — Ambulatory Visit: Payer: Self-pay | Attending: Family Medicine

## 2019-02-07 ENCOUNTER — Encounter: Payer: Self-pay | Admitting: Family Medicine

## 2019-02-08 ENCOUNTER — Telehealth: Payer: Self-pay | Admitting: Nurse Practitioner

## 2019-02-08 NOTE — Telephone Encounter (Signed)
I called Pt since received a print out of a bank acct, I LVM informed her that if this bank acct is her we need 3 month bank statement all the pages complete and if the bank acct belonged to someone else we need the 1st page of the last bank statement she has a new dateline that is 02/10/19 can not be late or the application and all the documentation she provide will be return to her and she need to schedule a new appt and submit a an application with a the complete papers

## 2019-02-10 ENCOUNTER — Telehealth: Payer: Self-pay | Admitting: Nurse Practitioner

## 2019-02-10 NOTE — Telephone Encounter (Signed)
Pt was called to informed her we got the papers

## 2019-02-10 NOTE — Telephone Encounter (Signed)
Patient states she dropped off her paperwork and wants to make sure it was received. Please follow up.

## 2019-02-23 MED FILL — ?CETIRIZINE HCL 10 MG TABLE: 10 | 30 days supply | Qty: 30 | Fill #1

## 2019-02-23 MED FILL — ?HYDROCHLOROTHIAZIDE 25MG T: 25 | 30 days supply | Qty: 30 | Fill #0

## 2019-02-23 MED FILL — ?AMLODIPINE BESYLATE 5MG TA: 5 | 30 days supply | Qty: 30 | Fill #3

## 2019-02-25 ENCOUNTER — Telehealth: Payer: Self-pay | Admitting: *Deleted

## 2019-02-25 MED FILL — ?ESOMEPRAZOLE MAG DR 40MG C: 40 | 30 days supply | Qty: 30 | Fill #1

## 2019-02-25 NOTE — Telephone Encounter (Signed)
Patient is needing to know the name of the [place] office that she was referred to.

## 2019-02-28 NOTE — Telephone Encounter (Signed)
I haven't refer her yet  Because   Pt do not have insurance I send a letter to patient on  7/12  to apply for the [employer] discount [employer] with application  to be refer to a Podiatry   specialist.

## 2019-02-28 NOTE — Telephone Encounter (Signed)
LMOM

## 2019-03-01 ENCOUNTER — Telehealth: Payer: Self-pay | Admitting: Nurse Practitioner

## 2019-03-01 NOTE — Telephone Encounter (Signed)
CMA spoke to referral coordinator. Will open referral for podiatry to reach out to patient.

## 2019-03-01 NOTE — Telephone Encounter (Signed)
New Message   Pt is calling wanting to know if she could get another podiatry referral since her insurance was approved. Please f/u

## 2019-03-04 ENCOUNTER — Telehealth: Payer: Self-pay | Admitting: Nurse Practitioner

## 2019-03-04 NOTE — Telephone Encounter (Signed)
Patient would like for you to give her a call.

## 2019-03-04 NOTE — Telephone Encounter (Signed)
Pt want to talk about specialist how the program works, I explain her that any specialist she need to get a referral from her PCP

## 2019-03-08 MED FILL — predniSONE 20 MG TABS: 20 | 7 days supply | Qty: 14 | Fill #0

## 2019-03-10 ENCOUNTER — Encounter: Payer: Self-pay | Admitting: Nurse Practitioner

## 2019-03-10 NOTE — Telephone Encounter (Signed)
Spoke with patient and she stated that the financial assistance was approved and would like to get her referral sent back tot the Podiatrist

## 2019-03-11 NOTE — Telephone Encounter (Signed)
Sent Referra to [employer]

## 2019-03-11 NOTE — Telephone Encounter (Signed)
LMOM

## 2019-03-15 NOTE — Telephone Encounter (Signed)
LMOM

## 2019-03-21 ENCOUNTER — Institutional Professional Consult (permissible substitution): Payer: Medicaid Other | Admitting: Licensed Clinical Social Worker

## 2019-03-22 ENCOUNTER — Encounter: Payer: Self-pay | Admitting: Nurse Practitioner

## 2019-03-22 ENCOUNTER — Other Ambulatory Visit: Payer: Self-pay

## 2019-03-22 ENCOUNTER — Ambulatory Visit: Payer: Self-pay | Attending: Nurse Practitioner | Admitting: Nurse Practitioner

## 2019-03-22 VITALS — BP 133/82 | HR 80 | Temp 99.2°F | Ht 66.0 in | Wt 351.6 lb

## 2019-03-22 DIAGNOSIS — Z1211 Encounter for screening for malignant neoplasm of colon: Secondary | ICD-10-CM

## 2019-03-22 DIAGNOSIS — K2100 Gastro-esophageal reflux disease with esophagitis, without bleeding: Secondary | ICD-10-CM

## 2019-03-22 DIAGNOSIS — Z13220 Encounter for screening for lipoid disorders: Secondary | ICD-10-CM

## 2019-03-22 DIAGNOSIS — M7751 Other enthesopathy of right foot: Secondary | ICD-10-CM

## 2019-03-22 DIAGNOSIS — G4700 Insomnia, unspecified: Secondary | ICD-10-CM

## 2019-03-22 DIAGNOSIS — Z1321 Encounter for screening for nutritional disorder: Secondary | ICD-10-CM

## 2019-03-22 DIAGNOSIS — F331 Major depressive disorder, recurrent, moderate: Secondary | ICD-10-CM

## 2019-03-22 MED ORDER — CITALOPRAM HYDROBROMIDE 40 MG PO TABS: 40.0000 mg | ORAL_TABLET | Freq: Every day | ORAL | 1 refills | Status: DC

## 2019-03-22 MED ORDER — TRAZODONE HCL 100 MG PO TABS: 50.0000 mg | ORAL_TABLET | Freq: Every day | ORAL | 0 refills | Status: DC

## 2019-03-22 MED FILL — ?CITALOPRAM HBR 40 MG TABLE: 40 | 30 days supply | Qty: 30 | Fill #0

## 2019-03-22 NOTE — Progress Notes (Signed)
Assessment & Plan:  Sheila Yoder was seen today for medication management.  Diagnoses and all orders for this visit:  Moderate episode of recurrent major depressive disorder (HCC) -     citalopram (CELEXA) 40 MG tablet; Take 1 tablet (40 mg total) by mouth daily.  Gastroesophageal reflux disease with esophagitis -     CBC -     Basic metabolic panel INSTRUCTIONS: Avoid GERD Triggers: acidic, spicy or fried foods, caffeine, coffee, sodas,  alcohol and chocolate.   Colon cancer screening -     Fecal occult blood, imunochemical([facility]); Future   Right calcaneal bursitis -     VITAMIN D 25 Hydroxy (Vit-D Deficiency, Fractures)  Encounter for vitamin deficiency screening -     VITAMIN D 25 Hydroxy (Vit-D Deficiency, Fractures)  Encounter for screening for lipid disorder -     Lipid panel  Insomnia, unspecified type -     traZODone (DESYREL) 100 MG tablet; Take 0.5-1 tablets (50-100 mg total) by mouth at bedtime.    Patient has been counseled on age-appropriate routine health concerns for screening and prevention. These are reviewed and up-to-date. Referrals have been placed accordingly. Immunizations are up-to-date or declined.    Subjective:   Chief Complaint  Patient presents with   Medication Management    Pt. is requesting if she can medication for depression and sleeping.    HPI Sheila Yoder 53 y.o. female presents to office today for HTN, GERD, depression and insomnia. She was referred to GI several months ago for colonoscopy but did not respond to their attempts to contact her.    She has disability papers with her today. I inquired as to what her medical disability is. She states she is trying to get disability for her chronic right foot pain (plantar fasciitis with history of pineal bursitis) and her depression. I have instructed her that I have not seen her since November and could not speak for her foot problem as she will be seeing a podiatrist however I  would fill out the form as accurate as possible considering she has not been closely followed by me for her depression in 10 months.  She does have an upcoming appointment with podiatry.  Insomnia She has been taking benadryl for insomnia. She takes 4 tablets prior to bedtime.  Can not recall what the actual dosage is for each tablet.  She states the benadryl does help her fall asleep however she does not want to continue to take benadryl at this time and is requesting a prescription sleep aid.   Anxiety and Depression I refilled her celexa in November however she states she was not aware that it had been refilled so she has not been taking it. She also states she could not tell much of a difference in her mood with taking 20mg  . Will increase to 40 mg. She does endorse more stress eating and weight is significantly increased since her last office visit with me. She is up 21lbs since November.  Depression screen [facility] 2/9 03/22/2019 01/05/2019 07/28/2018 05/26/2018  Decreased Interest 2 0 2 2  Down, Depressed, Hopeless 2 3 2 2   PHQ - 2 Score 4 3 4 4   Altered sleeping 1 3 1 2   Tired, decreased energy 3 3 2 1   Change in appetite 0 2 0 0  Feeling bad or failure about yourself  2 1 2 1   Trouble concentrating 2 0 2 2  Moving slowly or fidgety/restless 2 0 2 2  Suicidal thoughts  0 0 2 0  PHQ-9 Score 14 12 15 12    GAD 7 : Generalized Anxiety Score 03/22/2019 01/05/2019 07/28/2018 05/26/2018  Nervous, Anxious, on Edge 2 3 2 2   Control/stop worrying 1 3 2 3   Worry too much - different things 2 3 2 3   Trouble relaxing 2 3 2 3   Restless 1 3 2  -  Easily annoyed or irritable 2 3 2 1   Afraid - awful might happen 1 3 2 2   Total GAD 7 Score 11 21 14  -   ESSENTIAL HYPERTENSION Blood pressure controlled she endorses medication compliance taking amlodipine 5 mg daily. Denies chest pain, shortness of breath, palpitations, lightheadedness, dizziness, headaches or BLE edema.  She does not monitor her blood pressure  at home and she does not have a blood pressure machine. BP Readings from Last 3 Encounters:  03/22/19 133/82  07/28/18 124/86  05/26/18 (!) 145/80    Review of Systems  Constitutional: Negative for fever, malaise/fatigue and weight loss.  HENT: Negative.  Negative for nosebleeds.   Eyes: Negative.  Negative for blurred vision, double vision and photophobia.  Respiratory: Negative.  Negative for cough and shortness of breath.   Cardiovascular: Negative.  Negative for chest pain, palpitations and leg swelling.  Gastrointestinal: Positive for heartburn. Negative for nausea and vomiting.  Musculoskeletal: Positive for joint pain. Negative for myalgias.  Neurological: Negative.  Negative for dizziness, focal weakness, seizures and headaches.  Psychiatric/Behavioral: Positive for depression. Negative for suicidal ideas. The patient is nervous/anxious and has insomnia.     Past Medical History:  Diagnosis Date   Hypertension    Obesity    Peri-menopause 08/23/2014   Screening for STD (sexually transmitted disease) 08/23/2014    Past Surgical History:  Procedure Laterality Date   BREAST REDUCTION SURGERY     CESAREAN SECTION     CHOLECYSTECTOMY     TUBAL LIGATION      Family History  Problem Relation Age of Onset   Diabetes Other        maternal nephew   Hypertension Mother    Diabetes Mother    Arthritis Mother        rheumatoid   Hyperlipidemia Mother    Hypertension Sister    Diabetes Sister    Other Brother        hunting accident   Other Daughter        crohn's disease   Hypertension Sister    Diabetes Sister    Hypertension Sister    Hypertension Sister    Hypertension Sister    Lupus Sister    Other Brother        fell off building   Diabetes Brother    Hypertension Brother    Hypertension Brother     Social History Reviewed with no changes to be made today.   Outpatient Medications Prior to Visit  Medication Sig Dispense  Refill   acetaminophen (TYLENOL) 500 MG tablet Take 1,000 mg by mouth 2 (two) times daily as needed for headache.     amLODipine (NORVASC) 5 MG tablet Take 1 tablet (5 mg total) by mouth daily. 90 tablet 3   cetirizine (ZYRTEC) 10 MG tablet Take 1 tablet (10 mg total) by mouth daily. 30 tablet 11   cholecalciferol (VITAMIN D) 1000 UNITS tablet Take 1,000 Units by mouth daily.     esomeprazole (NEXIUM) 40 MG capsule Take 1 capsule (40 mg total) by mouth daily at 12 noon. 90 capsule 0   GARLIC  PO Take 30 mg by mouth daily.     Ginkgo Biloba (GNP GINGKO BILOBA EXTRACT PO) Take 1 capsule by mouth daily.     Probiotic Product (PROBIOTIC DAILY PO) Take 1 tablet by mouth daily.     vitamin E 100 UNIT capsule Take 100 Units by mouth daily.     Omega-3 Fatty Acids (OMEGA 3 PO) Take 1 capsule by mouth daily.     pantoprazole (PROTONIX) 40 MG tablet Take 1 tablet (40 mg total) by mouth 2 (two) times daily. (Patient not taking: Reported on 07/28/2018) 28 tablet 0   amoxicillin (AMOXIL) 500 MG capsule Take 2 capsules (1,000 mg total) by mouth 2 (two) times daily. (Patient not taking: Reported on 01/05/2019) 54 capsule 0   citalopram (CELEXA) 20 MG tablet Take 1 tablet (20 mg total) by mouth daily. (Patient not taking: Reported on 03/22/2019) 90 tablet 3   clarithromycin (BIAXIN) 500 MG tablet Take 1 tablet (500 mg total) by mouth 2 (two) times daily. (Patient not taking: Reported on 01/05/2019) 28 tablet 0   lisinopril (PRINIVIL,ZESTRIL) 10 MG tablet Take 1 tablet (10 mg total) by mouth daily. (Patient not taking: Reported on 05/26/2018) 30 tablet 0   No facility-administered medications prior to visit.     Allergies  Allergen Reactions   Bactrim [Sulfamethoxazole-Trimethoprim] Other (See Comments)    Chest pain       Objective:    BP 133/82 (BP Location: Left Arm, Patient Position: Sitting, Cuff Size: Large) Comment (Cuff Size): thigh cuff   Pulse 80    Temp 99.2 F (37.3 C) (Oral)    Ht  5\' 6"  (1.676 m)    Wt (!) 351 lb 9.6 oz (159.5 kg)    SpO2 98%    BMI 56.75 kg/m  Wt Readings from Last 3 Encounters:  03/22/19 (!) 351 lb 9.6 oz (159.5 kg)  05/26/18 (!) 330 lb 9.6 oz (150 kg)  08/23/14 (!) 318 lb (144.2 kg)    Physical Exam Vitals signs and nursing note reviewed.  Constitutional:      Appearance: She is morbidly obese.  HENT:     Head: Normocephalic and atraumatic.  Neck:     Musculoskeletal: Normal range of motion.  Cardiovascular:     Rate and Rhythm: Normal rate and regular rhythm.     Heart sounds: Normal heart sounds. No murmur. No friction rub. No gallop.   Pulmonary:     Effort: Pulmonary effort is normal. No tachypnea or respiratory distress.     Breath sounds: Normal breath sounds. No decreased breath sounds, wheezing, rhonchi or rales.  Chest:     Chest wall: No tenderness.  Abdominal:     General: Bowel sounds are normal.     Palpations: Abdomen is soft.  Musculoskeletal: Normal range of motion.  Skin:    General: Skin is warm and dry.  Neurological:     Mental Status: She is alert and oriented to person, place, and time.     Coordination: Coordination normal.  Psychiatric:        Behavior: Behavior normal. Behavior is cooperative.        Thought Content: Thought content normal.        Judgment: Judgment normal.       Patient has been counseled extensively about nutrition and exercise as well as the importance of adherence with medications and regular follow-up. The patient was given clear instructions to go to ER or return to medical center if symptoms don't improve, worsen  or new problems develop. The patient verbalized understanding.   Follow-up: Return in about 4 weeks (around 04/19/2019) for depression.   Gildardo Pounds, FNP-BC [facility] [place], [place]   03/22/2019, 7:09 PM

## 2019-03-22 NOTE — Addendum Note (Signed)
Addended byMariane Baumgarten on: 03/22/2019 04:14 PM   Modules accepted: Orders

## 2019-03-23 LAB — CBC
Hematocrit: 33.3 % — ABNORMAL LOW (ref 34.0–46.6)
Hemoglobin: 10.7 g/dL — ABNORMAL LOW (ref 11.1–15.9)
MCH: 29.8 pg (ref 26.6–33.0)
MCHC: 32.1 g/dL (ref 31.5–35.7)
MCV: 93 fL (ref 79–97)
Platelets: 291 10*3/uL (ref 150–450)
RBC: 3.59 x10E6/uL — ABNORMAL LOW (ref 3.77–5.28)
RDW: 12.7 % (ref 11.7–15.4)
WBC: 6.2 10*3/uL (ref 3.4–10.8)

## 2019-03-23 LAB — LIPID PANEL
Chol/HDL Ratio: 4.3 ratio (ref 0.0–4.4)
Cholesterol, Total: 225 mg/dL — ABNORMAL HIGH (ref 100–199)
HDL: 52 mg/dL (ref >39)
LDL Chol Calc (NIH): 157 mg/dL — ABNORMAL HIGH (ref 0–99)
Triglycerides: 93 mg/dL (ref 0–149)
VLDL Cholesterol Cal: 16 mg/dL (ref 5–40)

## 2019-03-23 LAB — BASIC METABOLIC PANEL
BUN/Creatinine Ratio: 12 (ref 9–23)
BUN: 9 mg/dL (ref 6–24)
CO2: 25 mmol/L (ref 20–29)
Calcium: 9.1 mg/dL (ref 8.7–10.2)
Chloride: 103 mmol/L (ref 96–106)
Creatinine, Ser: 0.75 mg/dL (ref 0.57–1.00)
GFR calc Af Amer: 107 mL/min/{1.73_m2} (ref >59)
GFR calc non Af Amer: 93 mL/min/{1.73_m2} (ref >59)
Glucose: 77 mg/dL (ref 65–99)
Potassium: 4.3 mmol/L (ref 3.5–5.2)
Sodium: 143 mmol/L (ref 134–144)

## 2019-03-23 LAB — VITAMIN D 25 HYDROXY (VIT D DEFICIENCY, FRACTURES): Vit D, 25-Hydroxy: 20 ng/mL — ABNORMAL LOW (ref 30.0–100.0)

## 2019-03-23 MED FILL — traZODone HCL 100 MG TABS: 100 | 30 days supply | Qty: 30 | Fill #0

## 2019-03-25 ENCOUNTER — Telehealth: Payer: Self-pay | Admitting: Nurse Practitioner

## 2019-03-25 DIAGNOSIS — M722 Plantar fascial fibromatosis: Secondary | ICD-10-CM

## 2019-03-25 NOTE — Telephone Encounter (Signed)
Patient dropped off paperwork will be placed in PCP box

## 2019-03-28 ENCOUNTER — Ambulatory Visit: Payer: No Typology Code available for payment source

## 2019-03-28 ENCOUNTER — Encounter: Payer: Medicaid Other | Admitting: Obstetrics and Gynecology

## 2019-03-28 ENCOUNTER — Encounter: Payer: Self-pay | Admitting: Podiatry

## 2019-03-28 ENCOUNTER — Ambulatory Visit (INDEPENDENT_AMBULATORY_CARE_PROVIDER_SITE_OTHER): Payer: No Typology Code available for payment source | Admitting: Podiatry

## 2019-03-28 ENCOUNTER — Other Ambulatory Visit: Payer: Self-pay

## 2019-03-28 VITALS — BP 159/90 | HR 82

## 2019-03-28 DIAGNOSIS — M722 Plantar fascial fibromatosis: Secondary | ICD-10-CM

## 2019-03-28 MED ORDER — MELOXICAM 15 MG PO TABS: 15.0000 mg | ORAL_TABLET | Freq: Every day | ORAL | 0 refills | Status: DC

## 2019-03-28 MED FILL — MELOXICAM 15 MG TABLET: 15 | 7 days supply | Qty: 14 | Fill #0

## 2019-03-28 NOTE — Patient Instructions (Signed)

## 2019-03-28 NOTE — Progress Notes (Signed)
Subjective:   Patient ID: Sheila Yoder, female   DOB: 53 y.o.   MRN: [id number]   HPI 53 year old female presents the office today for concerns of chronic bilateral foot pain.  She states that she has a history of plantar fasciitis which she had steroid injections for previously.  However she has not been seen by Dr. for this since 2011 as she states that she lost her insurance.  She states that she gets pain to the bottom of her heel as well as the arch of the foot especially tripping on her feet all day.  She states the injection therapy as well as inserts and help previously.  She states that she had to quit her job in 2017 as she works at [employer] in [employer] because of her feet.  She states that she cannot stand for long period of time due to the discomfort.  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  She is asking for pain medicine today.  She denies any new weakness or falls.  No injury to her feet.  She is currently in the process of applying for disability for both her feet as well as depression.   Review of Systems  All other systems reviewed and are negative.  Past Medical History:  Diagnosis Date  . Hypertension   . Obesity   . Peri-menopause 08/23/2014  . Screening for STD (sexually transmitted disease) 08/23/2014    Past Surgical History:  Procedure Laterality Date  . BREAST REDUCTION SURGERY    . CESAREAN SECTION    . CHOLECYSTECTOMY    . TUBAL LIGATION       Current Outpatient Medications:  .  aluminum-magnesium hydroxide-simethicone (MAALOX) 200-200-20 MG/5ML SUSP, Take by mouth., Disp: , Rfl:  .  acetaminophen (TYLENOL) 500 MG tablet, Take 1,000 mg by mouth 2 (two) times daily as needed for headache., Disp: , Rfl:  .  amLODipine (NORVASC) 5 MG tablet, Take 1 tablet (5 mg total) by mouth daily., Disp: 90 tablet, Rfl: 3 .  cetirizine  (ZYRTEC) 10 MG tablet, Take 1 tablet (10 mg total) by mouth daily., Disp: 30 tablet, Rfl: 11 .  cholecalciferol (VITAMIN D) 1000 UNITS tablet, Take 1,000 Units by mouth daily., Disp: , Rfl:  .  citalopram (CELEXA) 40 MG tablet, Take 1 tablet (40 mg total) by mouth daily., Disp: 90 tablet, Rfl: 1 .  esomeprazole (NEXIUM) 40 MG capsule, Take 1 capsule (40 mg total) by mouth daily at 12 noon., Disp: 90 capsule, Rfl: 0 .  GARLIC PO, Take 30 mg by mouth daily., Disp: , Rfl:  .  Ginkgo Biloba (GNP GINGKO BILOBA EXTRACT PO), Take 1 capsule by mouth daily., Disp: , Rfl:  .  meloxicam (MOBIC) 15 MG tablet, Take 1 tablet (15 mg total) by mouth daily., Disp: 14 tablet, Rfl: 0 .  Omega-3 Fatty Acids (OMEGA 3 PO), Take 1 capsule by mouth daily., Disp: , Rfl:  .  pantoprazole (PROTONIX) 40 MG tablet, Take 1 tablet (40 mg total) by mouth 2 (two) times daily. (Patient not taking: Reported on 07/28/2018), Disp: 28 tablet, Rfl: 0 .  Probiotic Product (PROBIOTIC DAILY PO), Take 1 tablet by mouth daily., Disp: , Rfl:  .  traZODone (DESYREL) 100 MG tablet, Take 0.5-1 tablets (50-100 mg total) by mouth at bedtime., Disp: 90 tablet, Rfl: 0 .  vitamin E  100 UNIT capsule, Take 100 Units by mouth daily., Disp: , Rfl:   Allergies  Allergen Reactions  . Bactrim [Sulfamethoxazole-Trimethoprim] Other (See Comments)    Chest pain          Objective:  Physical Exam  General: AAO x3, NAD  Dermatological: Skin is warm, dry and supple bilateral. Nails x 10 are well manicured; remaining integument appears unremarkable at this time. There are no open sores, no preulcerative lesions, no rash or signs of infection present.  Vascular: Dorsalis Pedis artery and Posterior Tibial artery pedal pulses are 2/4 bilateral with immedate capillary fill time. Pedal hair growth present. No varicosities and no lower extremity edema present bilateral. There is no pain with calf compression, swelling, warmth, erythema.   Neruologic: Grossly  intact via light touch bilateral. Vibratory intact via tuning fork bilateral. Protective threshold with Semmes Wienstein monofilament intact to all pedal sites bilateral. Negative tinel sign.   Musculoskeletal: Tenderness to palpation along the plantar medial tubercle of the calcaneus at the insertion of plantar fascia on the left and right foot. There is mild pain along the course of the plantar fascia within the arch of the foot. Plantar fascia appears to be intact. There is no pain with lateral compression of the calcaneus or pain with vibratory sensation. There is no pain along the course or insertion of the achilles tendon. No other areas of tenderness to bilateral lower extremities. Equinus is present. Flatfoot  Gait: Unassisted, Nonantalgic.       Assessment:   Bilateral chronic foot pain, plantar fasciitis    Plan:  -Treatment options discussed including all alternatives, risks, and complications -Etiology of symptoms were discussed -X-rays were obtained and reviewed with the patient. No evidence of acute fracture or stress fracture.  Flatfoot is present. -Bilateral steroid injections performed.  See procedure note below. -Stretching, icing daily -Prescribed mobic. Discussed side effects of the medication and directed to stop if any are to occur and call the office.  -Dispensed power steps. -Physical therapy referral.  -Discussed shoe modifications.  She is wearing very flat flip-flops. -Will hold narcotic pain medication. Discussed that hopefully the current treatment will be beneficial for her.   Return in about 3 weeks (around 04/18/2019).  Trula Slade DPM

## 2019-03-28 NOTE — Telephone Encounter (Signed)
-----   Message from Trula Slade, DPM sent at 03/28/2019  1:25 PM EDT ----- Can you please order PT for her through [facility]? Thanks.

## 2019-03-28 NOTE — Telephone Encounter (Signed)
Faxed order to [facility].

## 2019-03-28 NOTE — Telephone Encounter (Signed)
Will notified patient when ready.

## 2019-04-02 ENCOUNTER — Encounter (HOSPITAL_COMMUNITY): Payer: Self-pay | Admitting: Emergency Medicine

## 2019-04-02 ENCOUNTER — Emergency Department (HOSPITAL_COMMUNITY)
Admission: EM | Admit: 2019-04-02 | Discharge: 2019-04-02 | Disposition: A | Discharge status: Home / Self Care | Payer: Medicare Other | Attending: Emergency Medicine | Admitting: Emergency Medicine

## 2019-04-02 ENCOUNTER — Emergency Department (HOSPITAL_COMMUNITY): Payer: Medicare Other

## 2019-04-02 ENCOUNTER — Other Ambulatory Visit: Payer: Self-pay

## 2019-04-02 DIAGNOSIS — I10 Essential (primary) hypertension: Secondary | ICD-10-CM | POA: Diagnosis not present

## 2019-04-02 DIAGNOSIS — K2100 Gastro-esophageal reflux disease with esophagitis, without bleeding: Secondary | ICD-10-CM | POA: Diagnosis not present

## 2019-04-02 DIAGNOSIS — K222 Esophageal obstruction: Secondary | ICD-10-CM | POA: Insufficient documentation

## 2019-04-02 DIAGNOSIS — R079 Chest pain, unspecified: Secondary | ICD-10-CM | POA: Diagnosis present

## 2019-04-02 DIAGNOSIS — Z79899 Other long term (current) drug therapy: Secondary | ICD-10-CM | POA: Diagnosis not present

## 2019-04-02 DIAGNOSIS — R0789 Other chest pain: Secondary | ICD-10-CM

## 2019-04-02 LAB — I-STAT BETA HCG BLOOD, ED (NOT ORDERABLE): I-stat hCG, quantitative: <5 m[IU]/mL (ref <5)

## 2019-04-02 LAB — CBC
HCT: 36.9 % (ref 36.0–46.0)
Hemoglobin: 11.1 g/dL — ABNORMAL LOW (ref 12.0–15.0)
MCH: 29.4 pg (ref 26.0–34.0)
MCHC: 30.1 g/dL (ref 30.0–36.0)
MCV: 97.9 fL (ref 80.0–100.0)
Platelets: 316 10*3/uL (ref 150–400)
RBC: 3.77 MIL/uL — ABNORMAL LOW (ref 3.87–5.11)
RDW: 13.5 % (ref 11.5–15.5)
WBC: 7.3 10*3/uL (ref 4.0–10.5)
nRBC: 0 % (ref 0.0–0.2)

## 2019-04-02 LAB — TSH: TSH: 2.159 u[IU]/mL (ref 0.350–4.500)

## 2019-04-02 LAB — BASIC METABOLIC PANEL
Anion gap: 9 (ref 5–15)
BUN: 11 mg/dL (ref 6–20)
CO2: 27 mmol/L (ref 22–32)
Calcium: 8.9 mg/dL (ref 8.9–10.3)
Chloride: 104 mmol/L (ref 98–111)
Creatinine, Ser: 0.8 mg/dL (ref 0.44–1.00)
GFR calc Af Amer: >60 mL/min (ref >60)
GFR calc non Af Amer: >60 mL/min (ref >60)
Glucose, Bld: 96 mg/dL (ref 70–99)
Potassium: 3.7 mmol/L (ref 3.5–5.1)
Sodium: 140 mmol/L (ref 135–145)

## 2019-04-02 LAB — TROPONIN I (HIGH SENSITIVITY): Troponin I (High Sensitivity): 2 ng/L (ref <18)

## 2019-04-02 MED ORDER — LIDOCAINE VISCOUS HCL 2 % MT SOLN
15.0000 mL | Freq: Once | OROMUCOSAL | Status: AC
  Administered 2019-04-02: 22:00:00 15 mL via ORAL
  Filled 2019-04-02: qty 15

## 2019-04-02 MED ORDER — GI COCKTAIL ~~LOC~~: 30.0000 mL | Freq: Two times a day (BID) | ORAL | 0 refills | Status: DC

## 2019-04-02 MED ORDER — SUCRALFATE 1 G PO TABS: 1.0000 g | ORAL_TABLET | Freq: Three times a day (TID) | ORAL | 1 refills | Status: DC

## 2019-04-02 MED ORDER — SODIUM CHLORIDE 0.9% FLUSH: 3.0000 mL | Freq: Once | INTRAVENOUS | Status: DC

## 2019-04-02 MED ORDER — ALUM & MAG HYDROXIDE-SIMETH 200-200-20 MG/5ML PO SUSP
30.0000 mL | Freq: Once | ORAL | Status: AC
  Administered 2019-04-02: 30 mL via ORAL
  Filled 2019-04-02: qty 30

## 2019-04-02 MED ORDER — LANSOPRAZOLE 30 MG PO CPDR: 30.0000 mg | DELAYED_RELEASE_CAPSULE | Freq: Every day | ORAL | 1 refills | Status: DC

## 2019-04-02 NOTE — ED Provider Notes (Signed)
[facility] DEPT Provider Note   CSN: [id number] Arrival date & time: 04/02/19  1853     History   Chief Complaint Chief Complaint  Patient presents with  . Chest Pain    HPI Sheila Yoder is a 53 y.o. female.     The history is provided by the patient.  Chest Pain Pain location:  Substernal area and epigastric Pain quality: aching, burning and tightness   Pain radiates to:  Does not radiate Pain severity:  Moderate Onset quality:  Gradual Timing:  Intermittent Progression:  Waxing and waning Chronicity:  Recurrent Context: eating   Context comment:  Laying down Relieved by: sitting up. Exacerbated by: lying down. Ineffective treatments:  Antacids Associated symptoms: dysphagia, fatigue and heartburn   Associated symptoms: no altered mental status, no anorexia, no back pain, no cough, no diaphoresis, no fever, no lower extremity edema, no nausea, no shortness of breath, no vomiting and no weakness   Associated symptoms comment:  Feels like food is getting stuck.  Patient states about 7 years ago she had her esophagus stretched and is starting to feel the same.  She is taking her Nexium daily as prescribed but it does not seem to be helping and she is also tried Mylanta and Maalox but states at night she wakes up often with discomfort.  She has had significant weight gain over the last 6 months and states her body generally hurts.  She has had no fever, productive cough and no positive coag contacts.  She states she does have a dry cough that is been going on for some time related to allergies. Risk factors: obesity   Risk factors: no coronary artery disease, no diabetes mellitus, no immobilization, no prior DVT/PE and no smoking     Past Medical History:  Diagnosis Date  . Hypertension   . Obesity   . Peri-menopause 08/23/2014  . Screening for STD (sexually transmitted disease) 08/23/2014    Patient Active Problem List   Diagnosis Date  Noted  . Gastroesophageal reflux disease without esophagitis 07/15/2017  . Peri-menopause 08/23/2014  . Screening for STD (sexually transmitted disease) 08/23/2014    Past Surgical History:  Procedure Laterality Date  . BREAST REDUCTION SURGERY    . CESAREAN SECTION    . CHOLECYSTECTOMY    . TUBAL LIGATION       OB History    Gravida  2   Para  2   Term  2   Preterm      AB      Living  2     SAB      TAB      Ectopic      Multiple      Live Births               Home Medications    Prior to Admission medications   Medication Sig Start Date End Date Taking? Authorizing Provider  acetaminophen (TYLENOL) 500 MG tablet Take 1,000 mg by mouth 2 (two) times daily as needed for headache.   Yes [provider]  aluminum-magnesium hydroxide-simethicone (MAALOX) [id number] MG/5ML SUSP Take by mouth. 12/04/16  Yes [provider]  amLODipine (NORVASC) 5 MG tablet Take 1 tablet (5 mg total) by mouth daily. 05/26/18  Yes Gildardo Pounds, NP  cetirizine (ZYRTEC) 10 MG tablet Take 1 tablet (10 mg total) by mouth daily. 01/05/19  Yes Gildardo Pounds, NP  cholecalciferol (VITAMIN D) 1000 UNITS tablet Take 1,000 Units  by mouth daily.   Yes [provider]  citalopram (CELEXA) 40 MG tablet Take 1 tablet (40 mg total) by mouth daily. 03/22/19 06/20/19 Yes Gildardo Pounds, NP  esomeprazole (NEXIUM) 40 MG capsule Take 1 capsule (40 mg total) by mouth daily at 12 noon. 01/05/19  Yes Gildardo Pounds, NP  Omega-3 Fatty Acids (OMEGA 3 PO) Take 1 capsule by mouth daily.   Yes [provider]  Probiotic Product (PROBIOTIC DAILY PO) Take 1 tablet by mouth daily.   Yes [provider]  vitamin E 100 UNIT capsule Take 100 Units by mouth daily.   Yes [provider]  meloxicam (MOBIC) 15 MG tablet Take 1 tablet (15 mg total) by mouth daily. Patient not taking: Reported on 04/02/2019 03/28/19 03/27/20  Trula Slade, DPM  traZODone  (DESYREL) 100 MG tablet Take 0.5-1 tablets (50-100 mg total) by mouth at bedtime. Patient not taking: Reported on 04/02/2019 03/22/19 06/20/19  Gildardo Pounds, NP    Family History Family History  Problem Relation Age of Onset  . Diabetes Other        maternal nephew  . Hypertension Mother   . Diabetes Mother   . Arthritis Mother        rheumatoid  . Hyperlipidemia Mother   . Hypertension Sister   . Diabetes Sister   . Other Brother        hunting accident  . Other Daughter        crohn's disease  . Hypertension Sister   . Diabetes Sister   . Hypertension Sister   . Hypertension Sister   . Hypertension Sister   . Lupus Sister   . Other Brother        fell off building  . Diabetes Brother   . Hypertension Brother   . Hypertension Brother     Social History Social History   Tobacco Use  . Smoking status: Never Smoker  . Smokeless tobacco: Never Used  Substance Use Topics  . Alcohol use: No  . Drug use: No     Allergies   Bactrim [sulfamethoxazole-trimethoprim]   Review of Systems Review of Systems  Constitutional: Positive for fatigue. Negative for diaphoresis and fever.  HENT: Positive for trouble swallowing.   Respiratory: Negative for cough and shortness of breath.   Cardiovascular: Positive for chest pain.  Gastrointestinal: Positive for heartburn. Negative for anorexia, nausea and vomiting.  Musculoskeletal: Negative for back pain.  Neurological: Negative for weakness.  All other systems reviewed and are negative.    Physical Exam Updated Vital Signs BP (!) 150/85   Pulse 60   Temp 98.3 F (36.8 C) (Oral)   Resp 16   SpO2 100%   Physical Exam Vitals signs and nursing note reviewed.  Constitutional:      General: She is not in acute distress.    Appearance: She is well-developed. She is obese.  HENT:     Head: Normocephalic and atraumatic.     Mouth/Throat:     Mouth: Mucous membranes are moist.  Eyes:     Pupils: Pupils are equal,  round, and reactive to light.  Cardiovascular:     Rate and Rhythm: Normal rate and regular rhythm.     Heart sounds: Normal heart sounds. No murmur. No friction rub.  Pulmonary:     Effort: Pulmonary effort is normal.     Breath sounds: Normal breath sounds. No wheezing or rales.  Abdominal:     General: Bowel sounds  are normal. There is no distension.     Palpations: Abdomen is soft.     Tenderness: There is abdominal tenderness in the epigastric area. There is no guarding or rebound.  Musculoskeletal: Normal range of motion.        General: No tenderness.     Right lower leg: No edema.     Left lower leg: No edema.     Comments: No edema  Skin:    General: Skin is warm and dry.     Capillary Refill: Capillary refill takes less than 2 seconds.     Findings: No rash.  Neurological:     General: No focal deficit present.     Mental Status: She is alert and oriented to person, place, and time. Mental status is at baseline.     Cranial Nerves: No cranial nerve deficit.  Psychiatric:        Mood and Affect: Mood normal.        Behavior: Behavior normal.        Thought Content: Thought content normal.      ED Treatments / Results  Labs (all labs ordered are listed, but only abnormal results are displayed) Labs Reviewed  CBC - Abnormal; Notable for the following components:      Result Value   RBC 3.77 (*)    Hemoglobin 11.1 (*)    All other components within normal limits  BASIC METABOLIC PANEL  TSH  I-STAT BETA HCG BLOOD, ED ([facility], [facility], [facility] ONLY)  I-STAT BETA HCG BLOOD, ED (NOT ORDERABLE)  TROPONIN I (HIGH SENSITIVITY)    EKG EKG Interpretation  Date/Time:  Saturday April 02 2019 19:10:05 EDT Ventricular Rate:  74 PR Interval:    QRS Duration: 81 QT Interval:  362 QTC Calculation: 402 R Axis:   22 Text Interpretation:  Sinus rhythm Borderline repolarization abnormality Baseline wander in lead(s) I III aVL T wave inversion Anterior leads Confirmed by Blanchie Dessert [id number]) on 04/02/2019 9:30:25 PM   Radiology Dg Chest 2 View  Result Date: 04/02/2019 CLINICAL DATA:  Chest pain EXAM: CHEST - 2 VIEW COMPARISON:  None. FINDINGS: Normal heart size. Normal mediastinal contour. No pneumothorax. No pleural effusion. Lungs appear clear, with no acute consolidative airspace disease and no pulmonary edema. IMPRESSION: No active cardiopulmonary disease. Electronically Signed   By: Ilona Sorrel M.D.   On: 04/02/2019 19:42    Procedures Procedures (including critical care time)  Medications Ordered in ED Medications  sodium chloride flush (NS) 0.9 % injection 3 mL (has no administration in time range)  alum & mag hydroxide-simeth (MAALOX/MYLANTA) 200-200-20 MG/5ML suspension 30 mL (30 mLs Oral Given 04/02/19 2154)    And  lidocaine (XYLOCAINE) 2 % viscous mouth solution 15 mL (15 mLs Oral Given 04/02/19 2154)     Initial Impression / Assessment and Plan / ED Course  I have reviewed the triage vital signs and the nursing notes.  Pertinent labs & imaging results that were available during my care of the patient were reviewed by me and considered in my medical decision making (see chart for details).        Patient is a 53 year old female presenting today with atypical chest pain and GI symptoms.  She states the symptoms have worsened over the last 24 hours but it sounds like now for some time she is having worsening epigastric pain radiating into her chest with laying down, when she eats food it feels like it is getting stuck and causing discomfort  and almost makes her feel like she may regurgitate her food.  She has had no recent infectious symptoms and no positive COVID contacts.  She has no evidence of CHF today or signs of fluid overload.  EKG shows some ongoing T wave inversion but feel that it is progression of LVH.  Patient symptoms have been going on now for more than 12 hours and her troponin is less than 2.  She has gained significant amount of  weight over the last 6 months which would be making her GERD worse as well as she last had a esophageal dilation 7 years ago and may need repeat endoscopy.  Low suspicion for PE, dissection, ACS.  CBC BMP and chest x-ray without acute findings.  Will change patient's PPI to Prevacid and start Carafate.  Discussed eating pure or soft diet to avoid food bolus impaction.  Patient will call her GI doctor for repeat endoscopy.  TSH is pending and will contact patient if it is abnormal  12:01 AM TSH is wnl.  Final Clinical Impressions(s) / ED Diagnoses   Final diagnoses:  Atypical chest pain  Esophageal stricture  Gastroesophageal reflux disease with esophagitis without hemorrhage    ED Discharge Orders         Ordered    lansoprazole (PREVACID) 30 MG capsule  Daily     04/02/19 2215    sucralfate (CARAFATE) 1 g tablet  3 times daily with meals & bedtime     04/02/19 2215           Blanchie Dessert, MD 04/03/19 0002

## 2019-04-02 NOTE — Discharge Instructions (Addendum)
Make sure you are eating a soft diet and make sure you chew your food very well.  Avoid very spicy or fatty foods.  Start the new meds but it may take 1 to 2 weeks for them to kick in and be significantly effective.  Do not lay flat when you sleep and avoid eating 2 to 4 hours before you go to bed.

## 2019-04-02 NOTE — ED Triage Notes (Signed)
Pt reports having substernal chest pain that started yesterday.

## 2019-04-04 ENCOUNTER — Telehealth: Payer: Self-pay | Admitting: Nurse Practitioner

## 2019-04-04 MED FILL — SUCRALFATE 1 GM TABLET: 1 | 22 days supply | Qty: 90 | Fill #0

## 2019-04-04 MED FILL — LANSOPRAZOLE DR 30 MG CAPSU: 30 | 30 days supply | Qty: 30 | Fill #0

## 2019-04-04 NOTE — Telephone Encounter (Signed)
Patient called wanting to get an update on her paperwork for disability.states a VM can be left. Please follow up.

## 2019-04-06 LAB — FECAL OCCULT BLOOD, IMMUNOCHEMICAL: Fecal Occult Bld: NEGATIVE

## 2019-04-11 ENCOUNTER — Ambulatory Visit: Payer: Medicare Other | Attending: Podiatry | Admitting: Physical Therapy

## 2019-04-11 ENCOUNTER — Other Ambulatory Visit: Payer: Self-pay

## 2019-04-11 ENCOUNTER — Encounter: Payer: Self-pay | Admitting: Physical Therapy

## 2019-04-11 DIAGNOSIS — M6281 Muscle weakness (generalized): Secondary | ICD-10-CM

## 2019-04-11 DIAGNOSIS — R293 Abnormal posture: Secondary | ICD-10-CM | POA: Diagnosis present

## 2019-04-11 DIAGNOSIS — M25572 Pain in left ankle and joints of left foot: Secondary | ICD-10-CM

## 2019-04-11 DIAGNOSIS — M25571 Pain in right ankle and joints of right foot: Secondary | ICD-10-CM | POA: Diagnosis present

## 2019-04-11 NOTE — Telephone Encounter (Signed)
Will route to PCP. Attempt to reach patient to inform when the paperwork is ready, CMA will contact patient or fax it. No answer and LVM.

## 2019-04-11 NOTE — Therapy (Signed)
[place], [place], [address] Phone: [phone]   Fax:  [phone]  Physical Therapy Evaluation  Patient Details  Name: Sheila Yoder MRN: [id number] Date of Birth: 51-09-1965 Referring Provider (PT): Trula Slade, [place]    Encounter Date: 04/11/2019  PT End of Session - 04/11/19 1023    Visit Number  1    Number of Visits  13    Date for PT Re-Evaluation  05/23/19    Authorization Type  CAFA per pt report    PT Start Time  1010    PT Stop Time  1055    PT Time Calculation (min)  45 min    Activity Tolerance  Patient tolerated treatment well    Behavior During Therapy  [facility] for tasks assessed/performed       Past Medical History:  Diagnosis Date  . Anxiety    self reported  . Depression    self reported  . Hypertension   . Obesity   . Peri-menopause 08/23/2014  . Screening for STD (sexually transmitted disease) 08/23/2014    Past Surgical History:  Procedure Laterality Date  . BREAST REDUCTION SURGERY    . CESAREAN SECTION    . CHOLECYSTECTOMY    . TUBAL LIGATION      There were no vitals filed for this visit.   Subjective Assessment - 04/11/19 1014    Subjective  pt is a 53 y.o with CC of bil foot pain that has been going on for over years but reports increased worsening of pain with standing/ walking. Pain stays in the heel and radiates to the middle of the arch. pt denies any N/T, but does report intermittent swelling.    Limitations  Lifting;Standing;House hold activities    How long can you sit comfortably?  30    How long can you stand comfortably?  15-20 min    How long can you walk comfortably?  15-20 min    Diagnostic tests  03/28/2019 X-ray at podiatrist    Patient Stated Goals  to get feet in position to be able to stand long without pain, walking    Currently in Pain?  Yes    Pain Score  8    at worst 10/10   Pain Location  Foot    Pain Orientation  Right;Left    Pain  Descriptors / Indicators  Stabbing;Dull    Pain Type  Chronic pain    Pain Onset  More than a month ago    Pain Frequency  Intermittent    Aggravating Factors   standing/ walking, pressure    Pain Relieving Factors  exercise, pain medication, ice PRN    Effect of Pain on Daily Activities  limited standing/ walking, limited endurnace         [facility] PT Assessment - 04/11/19 0001      Assessment   Medical Diagnosis  Plantar fasciitis, bilateral M72.2    Referring Provider (PT)  Trula Slade, DPM     Onset Date/Surgical Date  --   since 2011   Hand Dominance  Right    Next MD Visit  04/26/2019    Prior Therapy  yes      Precautions   Precautions  None      Restrictions   Weight Bearing Restrictions  No      Balance Screen   Has the patient fallen in the past 6 months  No      Home  Film/video editor residence    Living Arrangements  Children    Available Help at Discharge  Family    Type of [place] Access  Level entry    [place]  None      Prior Function   Level of Independence  Independent with basic ADLs    Vocation  Unemployed    Leisure  travel,       Cognition   Overall Cognitive Status  Within Functional Limits for tasks assessed      Observation/Other Assessments   Focus on Therapeutic Outcomes (FOTO)   not setup in [place]   13/80      Posture/Postural Control   Posture/Postural Control  Postural limitations    Postural Limitations  Rounded Shoulders;Forward head      ROM / Strength   AROM / PROM / Strength  AROM;PROM;Strength      AROM   AROM Assessment Site  Ankle    Right/Left Ankle  Right;Left    Right Ankle Dorsiflexion  8    Right Ankle Plantar Flexion  38    Right Ankle Inversion  18    Right Ankle Eversion  28    Left Ankle Dorsiflexion  10    Left Ankle Plantar Flexion  44     Left Ankle Inversion  28    Left Ankle Eversion  5      PROM   PROM Assessment Site  Ankle    Right/Left Ankle  Right;Left    Right Ankle Plantar Flexion  42    Left Ankle Plantar Flexion  48    Left Ankle Eversion  8      Strength   Strength Assessment Site  Ankle    Right/Left Ankle  Right;Left    Right Ankle Dorsiflexion  4+/5    Right Ankle Plantar Flexion  4/5    Right Ankle Inversion  4/5    Right Ankle Eversion  4/5    Left Ankle Dorsiflexion  4+/5    Left Ankle Plantar Flexion  4/5    Left Ankle Inversion  4/5    Left Ankle Eversion  4/5      Palpation   Palpation comment  TTP along bil gastroc/soleus with multiple trigger points noted, bil medial calcaneal tubercle and along the PF on the L      Ambulation/Gait   Ambulation/Gait  Yes    Gait Pattern  Step-through pattern;Decreased stride length;Trendelenburg;Antalgic   bil hyperpronation and R foot everted               Objective measurements completed on examination: See above findings.      [facility] Adult PT Treatment/Exercise - 04/11/19 0001      Exercises   Exercises  Ankle      Ankle Exercises: Stretches   Gastroc Stretch  2 reps;30 seconds   seated     Ankle Exercises: Seated   Towel Crunch  4 reps    Other Seated Ankle Exercises  4-way ankle theraband strengthening 1 x 10 ea way with red theraband             PT Education - 04/11/19 1250    Education Details  evaluation findings, POC, goals, HEP with proper form/ rationale    Person(s) Educated  Patient  Methods  Explanation;Verbal cues;Handout    Comprehension  Verbalized understanding;Verbal cues required       PT Short Term Goals - 04/11/19 1105      PT SHORT TERM GOAL #1   Title  pt to be I with inital HEP    Time  3    Period  Weeks    Status  New    Target Date  05/02/19      PT SHORT TERM GOAL #2   Title  pt to verbalize and demo techniques to reduce pain and inflammation including proper gait mechanics and  modalities.    Time  3    Period  Weeks    Status  New    Target Date  05/02/19        PT Long Term Goals - 04/11/19 1105      PT LONG TERM GOAL #1   Title  pt to increase R ankle PF to >/= 50 degrees and report </= 2/10 overall for functional and efficent gait    Time  6    Period  Weeks    Status  New    Target Date  05/23/19      PT LONG TERM GOAL #2   Title  increase bil ankle strength to >/= 4+/5 to promote ankle stability and arch support with standing/ walking    Time  6    Period  Weeks    Status  New    Target Date  05/23/19      PT LONG TERM GOAL #3   Title  increasea walking/ stand for >/= 45 min and navigate up/down 1-2 flights of stairs reciprocally with </= 2/10 pain for functional endurance required community ambulation and potential work activities.    Time  6    Period  Weeks    Status  New    Target Date  05/23/19      PT LONG TERM GOAL #4   Title  increase LEFS score to >/= 50 / 80 to demo funcitonal improvement in function    Time  6    Period  Weeks    Status  New    Target Date  05/23/19      PT LONG TERM GOAL #5   Title  pt to be I with all HEP given as of last visit to maintain and progress current level of function    Time  6    Period  Weeks    Status  New    Target Date  05/23/19             Plan - 04/11/19 1059    Clinical Impression Statement  pt present to OPPT with CC of chronic bil foot/ heel pain starting back in 2011 with no specific MOI. She has functional ankle ROM with mild limitation of R ankle PF compared bil. MMT revealed mild gross weakness in bil ankle. she currently ambulated with antalgic gait pattern with R foot everted compared bil and stays in a pronated position. She would benefit from physical therapy to decrease bil foot pain, reduce calf stiffness, promote ankle strength and promote gait efficency and maximize overall function by addressing the deficits listed.    Personal Factors and Comorbidities   Age;Comorbidity 2    Comorbidities  hx of anxiety/ depression    Examination-Activity Limitations  Stand;Locomotion Level;Lift    Examination-Participation Restrictions  Other   reports unable to work due to pain   Stability/Clinical Decision  Making  Evolving/Moderate complexity    Clinical Decision Making  Moderate    Rehab Potential  Good    PT Frequency  2x / week    PT Duration  6 weeks    PT Treatment/Interventions  ADLs/Self Care Home Management;Cryotherapy;Electrical Stimulation;Iontophoresis 4mg /ml Dexamethasone;Moist Heat;Ultrasound;Gait training;Stair training;Therapeutic activities;Therapeutic exercise;Balance training;Patient/family education;Manual techniques;Passive range of motion;Dry needling;Taping;Vasopneumatic Device    PT Next Visit Plan  review/ update HEP PRN, ankle strengthening, stW along gastroc/soleues and PF, gait training, balance training.    PT Home Exercise Plan  towel scrunch, ankle 4-way strengthening, seated/ standing gastroc stretch,    Consulted and Agree with Plan of Care  Patient       Patient will benefit from skilled therapeutic intervention in order to improve the following deficits and impairments:  Obesity, Pain, Abnormal gait, Decreased strength, Increased muscle spasms, Improper body mechanics, Decreased activity tolerance, Decreased endurance, Decreased range of motion, Decreased balance, Increased edema  Visit Diagnosis: Pain in left ankle and joints of left foot - Plan: PT PLAN OF CARE CERT/RE-CERT  Pain in right ankle and joints of right foot - Plan: PT PLAN OF CARE CERT/RE-CERT  Abnormal posture - Plan: PT PLAN OF CARE CERT/RE-CERT  Muscle weakness (generalized) - Plan: PT PLAN OF CARE CERT/RE-CERT     Problem List Patient Active Problem List   Diagnosis Date Noted  . Gastroesophageal reflux disease without esophagitis 07/15/2017  . Peri-menopause 08/23/2014  . Screening for STD (sexually transmitted disease) 08/23/2014    Starr Lake PT, DPT, LAT, ATC  04/11/19  12:51 PM      [facility] [address] [place], [place], [address] Phone: [phone]   Fax:  [phone]  Name: Sheila Yoder MRN: [id number] Date of Birth: 25-Dec-1965

## 2019-04-21 ENCOUNTER — Ambulatory Visit: Payer: Medicaid Other | Admitting: Pharmacist

## 2019-04-21 ENCOUNTER — Other Ambulatory Visit: Payer: Self-pay

## 2019-04-21 ENCOUNTER — Ambulatory Visit (INDEPENDENT_AMBULATORY_CARE_PROVIDER_SITE_OTHER): Payer: No Typology Code available for payment source | Admitting: Podiatry

## 2019-04-21 DIAGNOSIS — M722 Plantar fascial fibromatosis: Secondary | ICD-10-CM

## 2019-04-21 MED ORDER — METHYLPREDNISOLONE 4 MG PO TBPK: ORAL_TABLET | ORAL | 0 refills | Status: DC

## 2019-04-21 MED FILL — ?METHYLPREDNISOLONE 4 MG TA: 4 | 6 days supply | Qty: 21 | Fill #0

## 2019-04-21 NOTE — Telephone Encounter (Signed)
Attempt to reach patient to inform that her paper work is ready for pickup.  She need to bring her paperwork for her foot to the podiatrist to be fill out by them.  No answer and LVM .

## 2019-04-21 NOTE — Patient Instructions (Signed)
Hold mobic while on the medrol dose pack (steroid). Once you finish the steroid you can go back to taking the meloxicam.

## 2019-04-21 NOTE — Progress Notes (Signed)
Subjective: 53 year old female presents the office for Pap evaluation bilateral foot pain, plantar fasciitis.  Overall she is doing better but she states that as she is on her feet more she still gets discomfort.  She did 1 session physical therapy.  She is been wearing the power steps.  Injection was helpful.  The stretching has also been helpful. Denies any systemic complaints such as fevers, chills, nausea, vomiting. No acute changes since last appointment, and no other complaints at this time.   Objective: AAO x3, NAD DP/PT pulses palpable bilaterally, CRT less than 3 seconds Continuation of tenderness to palpation on the plantar medial tubercle of the calcaneus insertion upon the fascia.  Plantar pressure appears to be intact.  No palpable compression of calcaneus.  No pain with Achilles tendon. No pain with calf compression, swelling, warmth, erythema  Assessment: Bilateral plantar fasciitis with improvement  Plan: -All treatment options discussed with the patient including all alternatives, risks, complications.  -Plan to hold anti-inflammatories for now I prescribed a Medrol Dosepak.  Continue stretching, icing daily.  Dispensed night splint.  Continue physical therapy.  Continue orthotics. -Patient encouraged to call the office with any questions, concerns, change in symptoms.   Trula Slade DPM

## 2019-04-22 ENCOUNTER — Ambulatory Visit: Payer: Medicaid Other | Admitting: Pharmacist

## 2019-04-25 ENCOUNTER — Ambulatory Visit: Payer: Self-pay | Attending: Family Medicine | Admitting: Pharmacist

## 2019-04-25 ENCOUNTER — Other Ambulatory Visit: Payer: Self-pay

## 2019-04-25 NOTE — Progress Notes (Signed)
Patient presents for vaccination against influenza per orders of Zelda. Consent given. Counseling provided. No contraindications exists. Vaccine administered without incident.   

## 2019-04-26 ENCOUNTER — Ambulatory Visit: Payer: Medicare Other | Admitting: Physical Therapy

## 2019-04-26 ENCOUNTER — Encounter: Payer: Self-pay | Admitting: Physical Therapy

## 2019-04-26 ENCOUNTER — Telehealth: Payer: Self-pay | Admitting: Nurse Practitioner

## 2019-04-26 DIAGNOSIS — R293 Abnormal posture: Secondary | ICD-10-CM

## 2019-04-26 DIAGNOSIS — M6281 Muscle weakness (generalized): Secondary | ICD-10-CM

## 2019-04-26 DIAGNOSIS — M25572 Pain in left ankle and joints of left foot: Secondary | ICD-10-CM

## 2019-04-26 DIAGNOSIS — M25571 Pain in right ankle and joints of right foot: Secondary | ICD-10-CM

## 2019-04-26 NOTE — Telephone Encounter (Signed)
Patient came and dropped off paper work

## 2019-04-26 NOTE — Therapy (Signed)
[place], [place], [address] Phone: [phone]   Fax:  [phone]  Physical Therapy Treatment  Patient Details  Name: Sheila Yoder MRN: [id number] Date of Birth: [birth year] Referring Provider (PT): Trula Slade, [place]    Encounter Date: 04/26/2019  PT End of Session - 04/26/19 1618    Visit Number  2    Number of Visits  13    Date for PT Re-Evaluation  05/23/19    Authorization Type  CAFA per pt report    PT Start Time  1520    PT Stop Time  1615    PT Time Calculation (min)  55 min    Activity Tolerance  Patient tolerated treatment well    Behavior During Therapy  [facility] for tasks assessed/performed       Past Medical History:  Diagnosis Date  . Anxiety    self reported  . Depression    self reported  . Hypertension   . Obesity   . Peri-menopause 08/23/2014  . Screening for STD (sexually transmitted disease) 08/23/2014    Past Surgical History:  Procedure Laterality Date  . BREAST REDUCTION SURGERY    . CESAREAN SECTION    . CHOLECYSTECTOMY    . TUBAL LIGATION      There were no vitals filed for this visit.                    [facility] Adult PT Treatment/Exercise - 04/26/19 0001      Exercises   Exercises  Ankle      Manual Therapy   Manual Therapy  Soft tissue mobilization    Manual therapy comments  15 minutes    Soft tissue mobilization  IASTM to bilateral plantar fascia      Ankle Exercises: Stretches   Gastroc Stretch  3 reps;30 seconds   seated     Ankle Exercises: Standing   Rocker Board  2 minutes;Limitations    Rocker Board Limitations  UE support    Toe Raise  10 reps    Other Standing Ankle Exercises  lunges holding stretch x 30 seconds      Ankle Exercises: Seated   Towel Crunch  4 reps    Towel Inversion/Eversion  5 reps    Other Seated Ankle Exercises  4-way ankle theraband strengthening 1 x 10 ea way with red theraband                PT Short Term Goals - 04/26/19 1617      PT SHORT TERM GOAL #1   Title  pt to be I with inital HEP    Time  3    Period  Weeks    Status  On-going    Target Date  05/02/19      PT SHORT TERM GOAL #2   Title  pt to verbalize and demo techniques to reduce pain and inflammation including proper gait mechanics and modalities.    Time  3    Period  Weeks    Target Date  05/02/19        PT Long Term Goals - 04/11/19 1105      PT LONG TERM GOAL #1   Title  pt to increase R ankle PF to >/= 50 degrees and report </= 2/10 overall for functional and efficent gait    Time  6    Period  Weeks    Status  New  Target Date  05/23/19      PT LONG TERM GOAL #2   Title  increase bil ankle strength to >/= 4+/5 to promote ankle stability and arch support with standing/ walking    Time  6    Period  Weeks    Status  New    Target Date  05/23/19      PT LONG TERM GOAL #3   Title  increasea walking/ stand for >/= 45 min and navigate up/down 1-2 flights of stairs reciprocally with </= 2/10 pain for functional endurance required community ambulation and potential work activities.    Time  6    Period  Weeks    Status  New    Target Date  05/23/19      PT LONG TERM GOAL #4   Title  increase LEFS score to >/= 50 / 80 to demo funcitonal improvement in function    Time  6    Period  Weeks    Status  New    Target Date  05/23/19      PT LONG TERM GOAL #5   Title  pt to be I with all HEP given as of last visit to maintain and progress current level of function    Time  6    Period  Weeks    Status  New    Target Date  05/23/19            Plan - 04/26/19 1614    Clinical Impression Statement  Pt arriving to therpay reporting 8/10 pain in bilateral feet. Pt tolerating stretching and exercises well reporting more pain on the right when compared to the left. Pt reporting less pain and tightness following IASTM to bilateral plantar fascia.Pt was instructed in  bilatera STM using a tennis ball. Continue with skilled PT.    Personal Factors and Comorbidities  Age;Comorbidity 2    Comorbidities  hx of anxiety/ depression, obesity    Examination-Activity Limitations  Stand;Locomotion Level;Lift    Examination-Participation Restrictions  Other    Stability/Clinical Decision Making  Evolving/Moderate complexity    PT Frequency  2x / week    PT Duration  6 weeks    PT Treatment/Interventions  ADLs/Self Care Home Management;Cryotherapy;Electrical Stimulation;Iontophoresis 4mg /ml Dexamethasone;Moist Heat;Ultrasound;Gait training;Stair training;Therapeutic activities;Therapeutic exercise;Balance training;Patient/family education;Manual techniques;Passive range of motion;Dry needling;Taping;Vasopneumatic Device    PT Next Visit Plan  review/ update HEP PRN, ankle strengthening, stW along gastroc/soleues and PF, gait training, balance training.    PT Home Exercise Plan  towel scrunch, ankle 4-way strengthening, seated/ standing gastroc stretch,    Consulted and Agree with Plan of Care  Patient       Patient will benefit from skilled therapeutic intervention in order to improve the following deficits and impairments:  Obesity, Pain, Abnormal gait, Decreased strength, Increased muscle spasms, Improper body mechanics, Decreased activity tolerance, Decreased endurance, Decreased range of motion, Decreased balance, Increased edema  Visit Diagnosis: Pain in left ankle and joints of left foot  Pain in right ankle and joints of right foot  Abnormal posture  Muscle weakness (generalized)     Problem List Patient Active Problem List   Diagnosis Date Noted  . Gastroesophageal reflux disease without esophagitis 07/15/2017  . Peri-menopause 08/23/2014  . Screening for STD (sexually transmitted disease) 08/23/2014   Kearney Hard, PT 04/26/19 4:20 PM    Oretha Caprice 04/26/2019, 4:19 PM  [facility] [address] [place], [place], [address] Phone:  [phone]   Fax:  [phone]  Name: Sheila Yoder MRN: [id number] Date of Birth: 06/04/1966

## 2019-04-27 NOTE — Telephone Encounter (Signed)
CMA left a VM for patient to inform her paperwork is ready for pick up.  The paperwork will be placed in the front for patient to pick up.

## 2019-04-28 ENCOUNTER — Ambulatory Visit: Payer: Medicare Other | Admitting: Physical Therapy

## 2019-04-28 ENCOUNTER — Encounter: Payer: Medicaid Other | Admitting: Obstetrics & Gynecology

## 2019-04-28 ENCOUNTER — Telehealth: Payer: Self-pay | Admitting: Podiatry

## 2019-04-28 DIAGNOSIS — M722 Plantar fascial fibromatosis: Secondary | ICD-10-CM

## 2019-04-28 NOTE — Telephone Encounter (Signed)
Patient wanted to know if she could get a referral for a cane. Please call patient at [phone]

## 2019-04-28 NOTE — Telephone Encounter (Signed)
Unable to leave a message informing pt of [facility] for the cane, I will mail copy of the [facility] rx form with directions to site after faxing to [place].

## 2019-04-28 NOTE — Addendum Note (Signed)
Addended by: Harriett Sine D on: 04/28/2019 04:23 PM   Modules accepted: Orders

## 2019-04-29 NOTE — Telephone Encounter (Signed)
Pt called asked if we provided canes. I told her no, I had tried to contact her yesterday with information concerning [facility] and had mailed orders for [facility] to her and faxed orders to [facility]. Pt asked if they took the [facility] 100% card and I told her no, but some pharmacies and [facility], which was near [place] may have canes for small cost.

## 2019-05-02 ENCOUNTER — Ambulatory Visit: Payer: Medicare Other | Admitting: Physical Therapy

## 2019-05-03 ENCOUNTER — Ambulatory Visit: Payer: Medicare Other | Admitting: Physical Therapy

## 2019-05-05 ENCOUNTER — Ambulatory Visit: Payer: Medicare Other | Attending: Podiatry | Admitting: Physical Therapy

## 2019-05-05 ENCOUNTER — Encounter: Payer: Self-pay | Admitting: Physical Therapy

## 2019-05-05 ENCOUNTER — Ambulatory Visit: Payer: Medicare Other | Admitting: Physical Therapy

## 2019-05-05 ENCOUNTER — Other Ambulatory Visit: Payer: Self-pay

## 2019-05-05 DIAGNOSIS — M25572 Pain in left ankle and joints of left foot: Secondary | ICD-10-CM | POA: Diagnosis present

## 2019-05-05 DIAGNOSIS — M25571 Pain in right ankle and joints of right foot: Secondary | ICD-10-CM | POA: Insufficient documentation

## 2019-05-05 DIAGNOSIS — M6281 Muscle weakness (generalized): Secondary | ICD-10-CM | POA: Insufficient documentation

## 2019-05-05 DIAGNOSIS — R293 Abnormal posture: Secondary | ICD-10-CM | POA: Diagnosis present

## 2019-05-06 ENCOUNTER — Encounter: Payer: Self-pay | Admitting: Physical Therapy

## 2019-05-06 NOTE — Therapy (Signed)
[place], [place], [address] Phone: [phone]   Fax:  [phone]  Physical Therapy Treatment  Patient Details  Name: Sheila Yoder MRN: [id number] Date of Birth: 05-03-1966 Referring Provider (PT): Trula Slade, [place]    Encounter Date: 05/05/2019  PT End of Session - 05/06/19 0711    Visit Number  3    Number of Visits  13    Date for PT Re-Evaluation  05/23/19    Authorization Type  CAFA per pt report    PT Start Time  1330    PT Stop Time  1414    PT Time Calculation (min)  44 min    Activity Tolerance  Patient tolerated treatment well    Behavior During Therapy  [facility] for tasks assessed/performed       Past Medical History:  Diagnosis Date  . Anxiety    self reported  . Depression    self reported  . Hypertension   . Obesity   . Peri-menopause 08/23/2014  . Screening for STD (sexually transmitted disease) 08/23/2014    Past Surgical History:  Procedure Laterality Date  . BREAST REDUCTION SURGERY    . CESAREAN SECTION    . CHOLECYSTECTOMY    . TUBAL LIGATION      There were no vitals filed for this visit.  Subjective Assessment - 05/05/19 1335    Subjective  Patient reports her pain today is about the same. Her pain is the same in both feet. Her pain level is about a 7/10. Patient requested to leave early because her feet are hurting. She was advised that that is the reasone she is here and that we only have a limited amount of time to work on her feet.,    Limitations  Lifting;Standing;House hold activities    How long can you sit comfortably?  30    How long can you stand comfortably?  15-20 min    How long can you walk comfortably?  15-20 min    Diagnostic tests  03/28/2019 X-ray at podiatrist    Patient Stated Goals  to get feet in position to be able to stand long without pain, walking    Currently in Pain?  Yes    Pain Score  7     Pain Location  Foot    Pain Orientation   Right;Left    Pain Descriptors / Indicators  Aching    Pain Type  Chronic pain    Pain Onset  More than a month ago    Pain Frequency  Intermittent    Aggravating Factors   Standing and walking                       [facility] Adult PT Treatment/Exercise - 05/06/19 0001      Self-Care   Self-Care  Other Self-Care Comments    Other Self-Care Comments   Spoke with wpatient about possibilities for inserts in her shoes. She will likley need some type of arch support.       Manual Therapy   Manual Therapy  Joint mobilization;Taping    Joint Mobilization  Anterior drawer mobilization     Soft tissue mobilization  IASTYM to bilateral plantar facia; IASTYM to bilateral calf; trigger point release of calf.     McConnell  arch tapining and pronation correction using mconnel tape       Ankle Exercises: Stretches   Gastroc Stretch  3 reps;30 seconds  seated reviewed the improtance of light stretching      Ankle Exercises: Seated   Other Seated Ankle Exercises  ankle PF and IV x20 red other execises not perfromed 2nd to time              PT Education - 05/06/19 0708    Education Details  benefits of orthotcis, purpose of PT    Person(s) Educated  Patient    Methods  Explanation;Demonstration;Tactile cues;Verbal cues    Comprehension  Verbalized understanding;Returned demonstration;Verbal cues required;Tactile cues required       PT Short Term Goals - 05/06/19 0717      PT SHORT TERM GOAL #1   Title  pt to be I with inital HEP    Time  3    Period  Weeks    Status  On-going    Target Date  05/02/19      PT SHORT TERM GOAL #2   Title  pt to verbalize and demo techniques to reduce pain and inflammation including proper gait mechanics and modalities.    Time  3    Period  Weeks    Status  On-going    Target Date  05/02/19        PT Long Term Goals - 04/11/19 1105      PT LONG TERM GOAL #1   Title  pt to increase R ankle PF to >/= 50 degrees and report </=  2/10 overall for functional and efficent gait    Time  6    Period  Weeks    Status  New    Target Date  05/23/19      PT LONG TERM GOAL #2   Title  increase bil ankle strength to >/= 4+/5 to promote ankle stability and arch support with standing/ walking    Time  6    Period  Weeks    Status  New    Target Date  05/23/19      PT LONG TERM GOAL #3   Title  increasea walking/ stand for >/= 45 min and navigate up/down 1-2 flights of stairs reciprocally with </= 2/10 pain for functional endurance required community ambulation and potential work activities.    Time  6    Period  Weeks    Status  New    Target Date  05/23/19      PT LONG TERM GOAL #4   Title  increase LEFS score to >/= 50 / 80 to demo funcitonal improvement in function    Time  6    Period  Weeks    Status  New    Target Date  05/23/19      PT LONG TERM GOAL #5   Title  pt to be I with all HEP given as of last visit to maintain and progress current level of function    Time  6    Period  Weeks    Status  New    Target Date  05/23/19            Plan - 05/06/19 [id number]    Clinical Impression Statement  Patient reported improvement after taping. She had several trigger points in her gastroc. Therapy focused on manual therapy to improve calf mobility and decrease trigger points. She was only able to work on a gentle sefl stretch and t-band strengthening because of time.    Personal Factors and Comorbidities  Age;Comorbidity 2    Comorbidities  hx of anxiety/  depression, obesity    Examination-Activity Limitations  Stand;Locomotion Level;Lift    Examination-Participation Restrictions  Other    Stability/Clinical Decision Making  Evolving/Moderate complexity    Clinical Decision Making  Moderate    Rehab Potential  Good    PT Frequency  2x / week    PT Duration  6 weeks    PT Treatment/Interventions  ADLs/Self Care Home Management;Cryotherapy;Electrical Stimulation;Iontophoresis 4mg /ml Dexamethasone;Moist  Heat;Ultrasound;Gait training;Stair training;Therapeutic activities;Therapeutic exercise;Balance training;Patient/family education;Manual techniques;Passive range of motion;Dry needling;Taping;Vasopneumatic Device    PT Next Visit Plan  review/ update HEP PRN, ankle strengthening, stW along gastroc/soleues and PF, gait training, balance training.    PT Home Exercise Plan  towel scrunch, ankle 4-way strengthening, seated/ standing gastroc stretch,    Consulted and Agree with Plan of Care  Patient       Patient will benefit from skilled therapeutic intervention in order to improve the following deficits and impairments:  Obesity, Pain, Abnormal gait, Decreased strength, Increased muscle spasms, Improper body mechanics, Decreased activity tolerance, Decreased endurance, Decreased range of motion, Decreased balance, Increased edema  Visit Diagnosis: Pain in left ankle and joints of left foot  Pain in right ankle and joints of right foot  Abnormal posture  Muscle weakness (generalized)     Problem List Patient Active Problem List   Diagnosis Date Noted  . Gastroesophageal reflux disease without esophagitis 07/15/2017  . Peri-menopause 08/23/2014  . Screening for STD (sexually transmitted disease) 08/23/2014    Carney Living PT DPT  05/06/2019, 7:19 AM  [facility] [address] [place], [place], [address] Phone: [phone]   Fax:  [phone]  Name: Sheila Yoder MRN: [id number] Date of Birth: Aug 28, 1965

## 2019-05-09 ENCOUNTER — Ambulatory Visit: Payer: Medicare Other | Admitting: Physical Therapy

## 2019-05-11 ENCOUNTER — Ambulatory Visit: Payer: Medicare Other | Admitting: Physical Therapy

## 2019-05-13 ENCOUNTER — Other Ambulatory Visit: Payer: Self-pay

## 2019-05-13 ENCOUNTER — Ambulatory Visit: Payer: Medicare Other | Admitting: Physical Therapy

## 2019-05-13 ENCOUNTER — Encounter: Payer: Self-pay | Admitting: Physical Therapy

## 2019-05-13 ENCOUNTER — Encounter: Payer: Self-pay | Admitting: Internal Medicine

## 2019-05-13 DIAGNOSIS — R293 Abnormal posture: Secondary | ICD-10-CM

## 2019-05-13 DIAGNOSIS — M25572 Pain in left ankle and joints of left foot: Secondary | ICD-10-CM

## 2019-05-13 DIAGNOSIS — M25571 Pain in right ankle and joints of right foot: Secondary | ICD-10-CM

## 2019-05-13 DIAGNOSIS — M6281 Muscle weakness (generalized): Secondary | ICD-10-CM

## 2019-05-13 NOTE — Therapy (Signed)
[place], [place], [address] Phone: [phone]   Fax:  [phone]  Physical Therapy Treatment  Patient Details  Name: Sheila Yoder MRN: [id number] Date of Birth: October 09, 1965 Referring Provider (PT): Trula Slade, [place]    Encounter Date: 05/13/2019  PT End of Session - 05/13/19 0936    Visit Number  4    Number of Visits  13    Date for PT Re-Evaluation  05/23/19    Authorization Type  CAFA per pt report    PT Start Time  0933    PT Stop Time  1014    PT Time Calculation (min)  41 min    Activity Tolerance  Patient tolerated treatment well    Behavior During Therapy  [facility] for tasks assessed/performed       Past Medical History:  Diagnosis Date  . Anxiety    self reported  . Depression    self reported  . Hypertension   . Obesity   . Peri-menopause 08/23/2014  . Screening for STD (sexually transmitted disease) 08/23/2014    Past Surgical History:  Procedure Laterality Date  . BREAST REDUCTION SURGERY    . CESAREAN SECTION    . CHOLECYSTECTOMY    . TUBAL LIGATION      There were no vitals filed for this visit.  Subjective Assessment - 05/13/19 0938    Subjective  Pt reports that she only had it on for 2-3 hours and it started itching and I had to take it off.  I am a 7/10 pain and it bothers me even when I am lying down at night to sleep or sitting up    Limitations  Lifting;Standing;House hold activities    How long can you sit comfortably?  30    How long can you stand comfortably?  15-20 min    How long can you walk comfortably?  15-20 min    Diagnostic tests  03/28/2019 X-ray at podiatrist    Patient Stated Goals  to get feet in position to be able to stand long without pain, walking    Currently in Pain?  Yes    Pain Score  7     Pain Location  Foot    Pain Orientation  Right;Left   LT worse than RT   Pain Descriptors / Indicators  Aching    Pain Type  Chronic pain    Pain  Onset  More than a month ago    Pain Frequency  Intermittent                       [facility] Adult PT Treatment/Exercise - 05/13/19 0001      Exercises   Exercises  Ankle      Manual Therapy   Manual Therapy  Soft tissue mobilization    Soft tissue mobilization  IASTYM to bilateral plantar facia; IASTYM to bilateral calf; trigger point release of calf.       Ankle Exercises: Standing   Heel Raises  10 reps   UE support on table   Other Standing Ankle Exercises  slant board with Great toes on board and gastroc stretch hold 10 and do 10 times      Ankle Exercises: Stretches   Gastroc Stretch  3 reps;30 seconds   standing with Great toe extended on towel      Ankle Exercises: Seated   Towel Crunch  4 reps    Heel Slides  Limitations  Heel raise bil with ball 2 x 10   difficult for pt but she feels mx working   Other Seated Ankle Exercises  4-way ankle theraband strengthening 1 x 10 ea way with red theraband    Other Seated Ankle Exercises  great toe ext/abd  bil with tennis ball 10 x 2    emphazize first Ray MT head making contact with floor            PT Education - 05/13/19 1016    Education Details  added to HEP and explained need for strengthening    Person(s) Educated  Patient    Methods  Explanation;Demonstration;Tactile cues;Verbal cues;Handout    Comprehension  Verbalized understanding;Returned demonstration       PT Short Term Goals - 05/06/19 0717      PT SHORT TERM GOAL #1   Title  pt to be I with inital HEP    Time  3    Period  Weeks    Status  On-going    Target Date  05/02/19      PT SHORT TERM GOAL #2   Title  pt to verbalize and demo techniques to reduce pain and inflammation including proper gait mechanics and modalities.    Time  3    Period  Weeks    Status  On-going    Target Date  05/02/19        PT Long Term Goals - 04/11/19 1105      PT LONG TERM GOAL #1   Title  pt to increase R ankle PF to >/= 50 degrees and report  </= 2/10 overall for functional and efficent gait    Time  6    Period  Weeks    Status  New    Target Date  05/23/19      PT LONG TERM GOAL #2   Title  increase bil ankle strength to >/= 4+/5 to promote ankle stability and arch support with standing/ walking    Time  6    Period  Weeks    Status  New    Target Date  05/23/19      PT LONG TERM GOAL #3   Title  increasea walking/ stand for >/= 45 min and navigate up/down 1-2 flights of stairs reciprocally with </= 2/10 pain for functional endurance required community ambulation and potential work activities.    Time  6    Period  Weeks    Status  New    Target Date  05/23/19      PT LONG TERM GOAL #4   Title  increase LEFS score to >/= 50 / 80 to demo funcitonal improvement in function    Time  6    Period  Weeks    Status  New    Target Date  05/23/19      PT LONG TERM GOAL #5   Title  pt to be I with all HEP given as of last visit to maintain and progress current level of function    Time  6    Period  Weeks    Status  New    Target Date  05/23/19            Plan - 05/13/19 1231    Clinical Impression Statement  Pt returns to clinic stating that arch taping helped her but she could only tolerate 2-3 hours without major itching/irritation.  Worked on posterior tib strength, great toe extension. Pt  encouraged to strengthen foot for pain control.    Personal Factors and Comorbidities  Age;Comorbidity 2    Comorbidities  hx of anxiety/ depression, obesity    Examination-Activity Limitations  Stand;Locomotion Level;Lift    Examination-Participation Restrictions  Other    Stability/Clinical Decision Making  Evolving/Moderate complexity    Clinical Decision Making  Moderate    Rehab Potential  Good    PT Frequency  2x / week    PT Duration  6 weeks    PT Treatment/Interventions  ADLs/Self Care Home Management;Cryotherapy;Electrical Stimulation;Iontophoresis 4mg /ml Dexamethasone;Moist Heat;Ultrasound;Gait training;Stair  training;Therapeutic activities;Therapeutic exercise;Balance training;Patient/family education;Manual techniques;Passive range of motion;Dry needling;Taping;Vasopneumatic Device    PT Next Visit Plan  review/ update HEP PRN, ankle strengthening, stW along gastroc/soleues and PF, gait training, balance training.    PT Home Exercise Plan  towel scrunch, ankle 4-way strengthening, seated/ standing gastroc stretch,great toe ext and gastroc stretch  Post tib strength    Consulted and Agree with Plan of Care  Patient       Patient will benefit from skilled therapeutic intervention in order to improve the following deficits and impairments:  Obesity, Pain, Abnormal gait, Decreased strength, Increased muscle spasms, Improper body mechanics, Decreased activity tolerance, Decreased endurance, Decreased range of motion, Decreased balance, Increased edema  Visit Diagnosis: Pain in left ankle and joints of left foot  Pain in right ankle and joints of right foot  Abnormal posture  Muscle weakness (generalized)     Problem List Patient Active Problem List   Diagnosis Date Noted  . Gastroesophageal reflux disease without esophagitis 07/15/2017  . Peri-menopause 08/23/2014  . Screening for STD (sexually transmitted disease) 08/23/2014    Voncille Lo, PT Certified Exercise Expert for the Aging Adult  05/13/19 12:38 PM Phone: [phone] Fax: [facility] [address] [place], [place], [address] Phone: [phone]   Fax:  [phone]  Name: Sheila Yoder MRN: [id number] Date of Birth: [birth year]

## 2019-05-13 NOTE — Patient Instructions (Addendum)
               Voncille Lo, PT Certified Exercise Expert for the Aging Adult  05/13/19 10:16 AM Phone: [phone] Fax: [phone]

## 2019-05-16 ENCOUNTER — Encounter: Payer: Medicaid Other | Admitting: Physical Therapy

## 2019-05-18 ENCOUNTER — Ambulatory Visit: Payer: Medicare Other | Admitting: Physical Therapy

## 2019-05-20 ENCOUNTER — Other Ambulatory Visit: Payer: Self-pay

## 2019-05-20 ENCOUNTER — Ambulatory Visit: Payer: Medicare Other | Admitting: Physical Therapy

## 2019-05-20 ENCOUNTER — Encounter: Payer: Self-pay | Admitting: Physical Therapy

## 2019-05-20 DIAGNOSIS — M25572 Pain in left ankle and joints of left foot: Secondary | ICD-10-CM

## 2019-05-20 DIAGNOSIS — M25571 Pain in right ankle and joints of right foot: Secondary | ICD-10-CM

## 2019-05-20 DIAGNOSIS — R293 Abnormal posture: Secondary | ICD-10-CM

## 2019-05-20 DIAGNOSIS — M6281 Muscle weakness (generalized): Secondary | ICD-10-CM

## 2019-05-20 NOTE — Therapy (Signed)
[place], [place], [address] Phone: [phone]   Fax:  [phone]  Physical Therapy Treatment  Patient Details  Name: Sheila Yoder MRN: [id number] Date of Birth: 1965/07/14 Referring Provider (PT): Trula Slade, [place]    Encounter Date: 05/20/2019  PT End of Session - 05/20/19 0918    Visit Number  5    Number of Visits  13    Date for PT Re-Evaluation  05/23/19    Authorization Type  CAFA per pt report    PT Start Time  0916    PT Stop Time  0958    PT Time Calculation (min)  42 min    Activity Tolerance  Patient tolerated treatment well    Behavior During Therapy  [facility] for tasks assessed/performed       Past Medical History:  Diagnosis Date  . Anxiety    self reported  . Depression    self reported  . Hypertension   . Obesity   . Peri-menopause 08/23/2014  . Screening for STD (sexually transmitted disease) 08/23/2014    Past Surgical History:  Procedure Laterality Date  . BREAST REDUCTION SURGERY    . CESAREAN SECTION    . CHOLECYSTECTOMY    . TUBAL LIGATION      There were no vitals filed for this visit.  Subjective Assessment - 05/20/19 0919    Subjective  "I feel like I am getting better pain is a 6/10 today in both feet"    Patient Stated Goals  to get feet in position to be able to stand long without pain, walking    Currently in Pain?  Yes    Pain Score  6     Pain Location  Foot    Pain Orientation  Right;Left    Pain Descriptors / Indicators  Aching;Sore    Pain Type  Chronic pain    Aggravating Factors   Standing/ walking for a long time                       [facility] Adult PT Treatment/Exercise - 05/20/19 0001      Modalities   Modalities  Ultrasound      Ultrasound   Ultrasound Location  bil posterior tib     Ultrasound Parameters  1 mhz @ 1.0 w/cm2 x 8 min ( 4 min ea side)    Ultrasound Goals  Pain      Manual Therapy   Manual therapy comments   MTPR along bil posterior tib x 2    Joint Mobilization  Anterior drawer mobilization     Soft tissue mobilization  IASTM along the bil posterior tib    McConnell  bil arch tapining and pronation correction using mconnel tape       Ankle Exercises: Stretches   Gastroc Stretch  2 reps;30 seconds   bil            PT Education - 05/20/19 0958    Education Details  benefits of getting inserts in the shoes to promote proper foot positioning    Person(s) Educated  Patient    Methods  Explanation;Verbal cues;Handout    Comprehension  Verbalized understanding;Verbal cues required       PT Short Term Goals - 05/06/19 0717      PT SHORT TERM GOAL #1   Title  pt to be I with inital HEP    Time  3    Period  Weeks    Status  On-going    Target Date  05/02/19      PT SHORT TERM GOAL #2   Title  pt to verbalize and demo techniques to reduce pain and inflammation including proper gait mechanics and modalities.    Time  3    Period  Weeks    Status  On-going    Target Date  05/02/19        PT Long Term Goals - 04/11/19 1105      PT LONG TERM GOAL #1   Title  pt to increase R ankle PF to >/= 50 degrees and report </= 2/10 overall for functional and efficent gait    Time  6    Period  Weeks    Status  New    Target Date  05/23/19      PT LONG TERM GOAL #2   Title  increase bil ankle strength to >/= 4+/5 to promote ankle stability and arch support with standing/ walking    Time  6    Period  Weeks    Status  New    Target Date  05/23/19      PT LONG TERM GOAL #3   Title  increasea walking/ stand for >/= 45 min and navigate up/down 1-2 flights of stairs reciprocally with </= 2/10 pain for functional endurance required community ambulation and potential work activities.    Time  6    Period  Weeks    Status  New    Target Date  05/23/19      PT LONG TERM GOAL #4   Title  increase LEFS score to >/= 50 / 80 to demo funcitonal improvement in function    Time  6    Period   Weeks    Status  New    Target Date  05/23/19      PT LONG TERM GOAL #5   Title  pt to be I with all HEP given as of last visit to maintain and progress current level of function    Time  6    Period  Weeks    Status  New    Target Date  05/23/19            Plan - 05/20/19 [id number]    Clinical Impression Statement  pt reports some improvement in the feet rating pain at 6/10 today. on posterior tibilais relief using [place] and IASTM / MTPR techniques which she noted relief of pain, continued stretching of the calves and taping of the arch to promote stability of the arch which she reported felt much better. Discussed benefits of getting shoe inserts/orthotics to promote arch support.    PT Next Visit Plan  review/ update HEP PRN, ankle strengthening, stW along gastroc/soleues and PF, gait training, balance training. how was [place] and taping? did she get inserts?    PT Home Exercise Plan  towel scrunch, ankle 4-way strengthening, seated/ standing gastroc stretch,great toe ext and gastroc stretch  Post tib strength    Consulted and Agree with Plan of Care  Patient       Patient will benefit from skilled therapeutic intervention in order to improve the following deficits and impairments:     Visit Diagnosis: Pain in left ankle and joints of left foot  Pain in right ankle and joints of right foot  Abnormal posture  Muscle weakness (generalized)     Problem List Patient Active Problem List   Diagnosis  Date Noted  . Gastroesophageal reflux disease without esophagitis 07/15/2017  . Peri-menopause 08/23/2014  . Screening for STD (sexually transmitted disease) 08/23/2014   Starr Lake PT, DPT, LAT, ATC  05/20/19  10:01 AM      [facility] [facility] [address] [place], [place], [address] Phone: [phone]   Fax:  [phone]  Name: Sheila Yoder MRN: [id number] Date of Birth: Apr 22, 1966

## 2019-05-23 ENCOUNTER — Other Ambulatory Visit: Payer: Self-pay

## 2019-05-23 ENCOUNTER — Ambulatory Visit (INDEPENDENT_AMBULATORY_CARE_PROVIDER_SITE_OTHER): Payer: No Typology Code available for payment source | Admitting: Podiatry

## 2019-05-23 ENCOUNTER — Encounter: Payer: Medicaid Other | Admitting: Physical Therapy

## 2019-05-23 DIAGNOSIS — M722 Plantar fascial fibromatosis: Secondary | ICD-10-CM

## 2019-05-23 MED ORDER — MELOXICAM 15 MG PO TABS: 15.0000 mg | ORAL_TABLET | Freq: Every day | ORAL | 0 refills | Status: DC

## 2019-05-23 MED FILL — MELOXICAM 15 MG TABLET: 15 | 14 days supply | Qty: 14 | Fill #0

## 2019-05-23 NOTE — Progress Notes (Signed)
Subjective: 53 year old female presents the office today for evaluation of bilateral foot pain plantar fasciitis.  She states that she is doing much better and physical therapy has been helpful.  She is very happy that she is in therapy and overall her pain is improved.  The inserts of been helpful as well as the stretching, night splint as well as a plantar fascial taping. Denies any systemic complaints such as fevers, chills, nausea, vomiting. No acute changes since last appointment, and no other complaints at this time.   Objective: AAO x3, NAD DP/PT pulses palpable bilaterally, CRT less than 3 seconds There is still some mild tenderness palpation on plantar medial tubercle of the calcaneus at insertion of the plantar fascia.  Plantar fascial appears to be intact.  Negative Tinel sign.  No pain with Achilles tendon.  Equinus is present. No pain with calf compression, swelling, warmth, erythema  Assessment: Bilateral plantar fasciitis with improvement  Plan: -All treatment options discussed with the patient including all alternatives, risks, complications.  -Overall she is doing much better.  I want her to continue physical therapy as this has been helpful for her.  Continue with supportive shoes and also discussed orthotics.  Discussed stretching exercises at home as well. -Patient encouraged to call the office with any questions, concerns, change in symptoms.   Trula Slade DPM

## 2019-05-24 ENCOUNTER — Ambulatory Visit: Payer: Medicare Other | Admitting: Physical Therapy

## 2019-05-25 ENCOUNTER — Other Ambulatory Visit: Payer: Self-pay

## 2019-05-25 ENCOUNTER — Ambulatory Visit: Payer: Medicare Other | Admitting: Physical Therapy

## 2019-05-25 ENCOUNTER — Encounter: Payer: Self-pay | Admitting: Physical Therapy

## 2019-05-25 DIAGNOSIS — M25572 Pain in left ankle and joints of left foot: Secondary | ICD-10-CM | POA: Diagnosis not present

## 2019-05-25 DIAGNOSIS — R293 Abnormal posture: Secondary | ICD-10-CM

## 2019-05-25 DIAGNOSIS — M6281 Muscle weakness (generalized): Secondary | ICD-10-CM

## 2019-05-25 DIAGNOSIS — M25571 Pain in right ankle and joints of right foot: Secondary | ICD-10-CM

## 2019-05-25 NOTE — Patient Instructions (Signed)

## 2019-05-25 NOTE — Therapy (Addendum)
[place], [place], [address] Phone: [phone]   Fax:  [phone]  Physical Therapy Treatment / Re-certification/ discharge   Patient Details  Name: Sheila Yoder MRN: [id number] Date of Birth: Mar 07, 1966 Referring Provider (PT): Trula Slade, [place]    Encounter Date: 05/25/2019  PT End of Session - 05/25/19 1015    Visit Number  6    Number of Visits  13    Date for PT Re-Evaluation  06/22/19    Authorization Type  CAFA per pt report    PT Start Time  1015    PT Stop Time  1055    PT Time Calculation (min)  40 min    Activity Tolerance  Patient tolerated treatment well    Behavior During Therapy  [facility] for tasks assessed/performed       Past Medical History:  Diagnosis Date  . Anxiety    self reported  . Depression    self reported  . Hypertension   . Obesity   . Peri-menopause 08/23/2014  . Screening for STD (sexually transmitted disease) 08/23/2014    Past Surgical History:  Procedure Laterality Date  . BREAST REDUCTION SURGERY    . CESAREAN SECTION    . CHOLECYSTECTOMY    . TUBAL LIGATION      There were no vitals filed for this visit.  Subjective Assessment - 05/25/19 1016    Subjective  "The R heel has been feeling more sore since I saw the DPM"    Currently in Pain?  Yes    Pain Score  8    R foot is 8/10, and L 4/10   Pain Orientation  Right;Left    Pain Type  Chronic pain    Pain Onset  More than a month ago    Pain Frequency  Intermittent    Aggravating Factors   walking/ standing/         [facility] PT Assessment - 05/25/19 0001      Observation/Other Assessments   Other Surveys   Lower Extremity Functional Scale    Lower Extremity Functional Scale   12/80      AROM   Right Ankle Plantar Flexion  35    Left Ankle Plantar Flexion  40      Strength   Right Ankle Dorsiflexion  4+/5    Right Ankle Plantar Flexion  4/5    Right Ankle Inversion  4/5    Right Ankle  Eversion  4/5    Left Ankle Dorsiflexion  4+/5    Left Ankle Plantar Flexion  4/5    Left Ankle Inversion  4/5    Left Ankle Eversion  4/5                   [facility] Adult PT Treatment/Exercise - 05/25/19 0001      Modalities   Modalities  Iontophoresis      Iontophoresis   Type of Iontophoresis  Dexamethasone    Location  R medial calcaneal tubercle    Dose  30m/ml    Time  6 hour patch      Manual Therapy   Manual therapy comments  MTPR along bil posterior tib x 2    Joint Mobilization  Anterior drawer mobilization     Soft tissue mobilization  IASTM along the bil posterior tib    McConnell  bil arch tapining      Ankle Exercises: Stretches   Gastroc Stretch  2  reps;30 seconds   bil     Ankle Exercises: Seated   Towel Crunch  4 reps   bil   Heel Raises  Both;20 reps   with inversion tp facilitate posterior tib strength     Ankle Exercises: Supine   Other Supine Ankle Exercises  inversion bil with bil toe scrunch 1 x 15 holding for 5 seconds ea.             PT Education - 05/25/19 1055    Education Details  reviewed previously provided HEp and benefits of iontophoresis    Person(s) Educated  Patient    Methods  Explanation;Verbal cues    Comprehension  Verbalized understanding;Verbal cues required       PT Short Term Goals - 05/25/19 1025      PT SHORT TERM GOAL #1   Title  pt to be I with inital HEP    Period  Weeks    Status  Achieved      PT SHORT TERM GOAL #2   Title  pt to verbalize and demo techniques to reduce pain and inflammation including proper gait mechanics and modalities.    Period  Weeks    Status  Achieved        PT Long Term Goals - 05/25/19 1026      PT LONG TERM GOAL #1   Title  pt to increase R ankle PF to >/= 50 degrees and report </= 2/10 overall for functional and efficent gait    Period  Weeks    Status  Achieved    Target Date  06/22/19      PT LONG TERM GOAL #2   Title  increase bil ankle strength to >/=  4+/5 to promote ankle stability and arch support with standing/ walking    Period  Weeks    Status  On-going    Target Date  06/22/19      PT LONG TERM GOAL #3   Title  increasea walking/ stand for >/= 45 min and navigate up/down 1-2 flights of stairs reciprocally with </= 2/10 pain for functional endurance required community ambulation and potential work activities.    Baseline  walking 5-7 min , pain at 8/10  in the R heel    Period  Weeks    Status  On-going    Target Date  06/22/19      PT LONG TERM GOAL #4   Title  increase LEFS score to >/= 50 / 80 to demo funcitonal improvement in function    Baseline  12/80    Time  6    Period  Weeks    Status  On-going    Target Date  06/22/19      PT LONG TERM GOAL #5   Title  pt to be I with all HEP given as of last visit to maintain and progress current level of function    Period  Weeks    Target Date  06/22/19            Plan - 05/25/19 1056    Clinical Impression Statement  pt reports she has been doing well the last few days but reports increased pain inthe R heel rated at 8/10 and was requesting an injection, discussed we dont' do injections but could trial iontophoresis. continued working ROM to promote PF and strengthening of the ankle. educated and consent was provided for iontophoresis on the R ankle, continued taping to promote medial arch support. She met  her STG's and is working toward her LTG's. plan to conitnue with current treatment plan for the next 4 week, discussed if no progress is noted in this time frame then plan to discharge back to her MD.    PT Frequency  2x / week    PT Duration  4 weeks    PT Treatment/Interventions  ADLs/Self Care Home Management;Cryotherapy;Electrical Stimulation;Iontophoresis 90m/ml Dexamethasone;Moist Heat;Ultrasound;Gait training;Stair training;Therapeutic activities;Therapeutic exercise;Balance training;Patient/family education;Manual techniques;Passive range of motion;Dry  needling;Taping;Vasopneumatic Device    PT Next Visit Plan  review/ update HEP PRN, ankle strengthening, stW along gastroc/soleues and PF, gait training, balance training. how was [place] taping? did she get inserts? how was inserts.    PT Home Exercise Plan  towel scrunch, ankle 4-way strengthening, seated/ standing gastroc stretch,great toe ext and gastroc stretch  Post tib strength    Consulted and Agree with Plan of Care  Patient       Patient will benefit from skilled therapeutic intervention in order to improve the following deficits and impairments:  Obesity, Pain, Abnormal gait, Decreased strength, Increased muscle spasms, Improper body mechanics, Decreased activity tolerance, Decreased endurance, Decreased range of motion, Decreased balance, Increased edema  Visit Diagnosis: Pain in left ankle and joints of left foot  Pain in right ankle and joints of right foot  Abnormal posture  Muscle weakness (generalized)     Problem List Patient Active Problem List   Diagnosis Date Noted  . Gastroesophageal reflux disease without esophagitis 07/15/2017  . Peri-menopause 08/23/2014  . Screening for STD (sexually transmitted disease) 08/23/2014   KStarr LakePT, DPT, LAT, ATC  05/25/19  11:02 AM      CJudCPleasant [facility] AvenueGTimber Lakes NAl[place] [address]Phone: [phone]  Fax:  [phone] Name: SCharina FonsMRN: [id number] of Birth: [birth year]    PHYSICAL THERAPY DISCHARGE SUMMARY  Visits from Start of Care: 6  Current functional level related to goals / functional outcomes: See goals   Remaining deficits: unknown   Education / Equipment: HEP, theraband, posture  Plan: Patient agrees to discharge.  Patient goals were not met. Patient is being discharged due to not returning since the last visit.  ?????         Rosine Solecki PT, DPT, LAT, ATC  07/12/19  2:44 PM

## 2019-05-30 DIAGNOSIS — M722 Plantar fascial fibromatosis: Secondary | ICD-10-CM | POA: Insufficient documentation

## 2019-06-01 ENCOUNTER — Encounter: Payer: Medicaid Other | Admitting: Physical Therapy

## 2019-06-02 ENCOUNTER — Encounter: Payer: Medicaid Other | Admitting: Obstetrics & Gynecology

## 2019-06-08 ENCOUNTER — Ambulatory Visit: Payer: Self-pay | Attending: Podiatry | Admitting: Physical Therapy

## 2019-06-08 ENCOUNTER — Telehealth: Payer: Self-pay | Admitting: Physical Therapy

## 2019-06-08 NOTE — Telephone Encounter (Signed)
LVM regarding missed appointment today and noted when her next scheduled visit is. If she is unable to make the visit to call and we can cancel or reschedule that appointment for her.   Aella Ronda PT, DPT, LAT, ATC  06/08/19  11:52 AM

## 2019-06-10 ENCOUNTER — Ambulatory Visit: Payer: Self-pay | Admitting: Physical Therapy

## 2019-06-13 ENCOUNTER — Telehealth: Payer: Self-pay | Admitting: Physical Therapy

## 2019-06-13 ENCOUNTER — Ambulatory Visit: Payer: Self-pay | Admitting: Physical Therapy

## 2019-06-13 NOTE — Telephone Encounter (Signed)
Spoke with pt regarding missed appointment today, and when her next scheduled appointment is. Discussed that per clinic policy after 2 missed appointments we are required to cancel future visits beyond the next scheduled appointment. If she is unable to make that appointment to call and let us know we can cancel or reschedule that appointment, if she misses that last appointment per clinic policy is grounds for discharge following 3 missed appointments. She noted understanding and that she will plan to attend the next visit.  Shadd Dunstan PT, DPT, LAT, ATC  06/13/19  12:50 PM

## 2019-06-16 ENCOUNTER — Encounter: Payer: Self-pay | Admitting: Internal Medicine

## 2019-06-16 ENCOUNTER — Ambulatory Visit (INDEPENDENT_AMBULATORY_CARE_PROVIDER_SITE_OTHER): Payer: Self-pay | Admitting: Internal Medicine

## 2019-06-16 VITALS — BP 152/80 | HR 84 | Temp 97.9°F | Ht 66.0 in | Wt 340.4 lb

## 2019-06-16 DIAGNOSIS — R1310 Dysphagia, unspecified: Secondary | ICD-10-CM

## 2019-06-16 DIAGNOSIS — K219 Gastro-esophageal reflux disease without esophagitis: Secondary | ICD-10-CM

## 2019-06-16 DIAGNOSIS — Z8619 Personal history of other infectious and parasitic diseases: Secondary | ICD-10-CM

## 2019-06-16 DIAGNOSIS — K589 Irritable bowel syndrome without diarrhea: Secondary | ICD-10-CM

## 2019-06-16 DIAGNOSIS — Z1211 Encounter for screening for malignant neoplasm of colon: Secondary | ICD-10-CM

## 2019-06-16 NOTE — Progress Notes (Signed)
HISTORY OF PRESENT ILLNESS:  Sheila Yoder is a 53 y.o. female with morbid obesity, hypertension, and anxiety/depression who was sent today by the health and wellness clinic regarding multiple GI complaints and the need for endoscopic procedures.  Patient tells me that she has a history of GERD complicated by peptic stricture for which she underwent esophageal dilation in [place] around 2013 (no details).  Also tells me that she has had a "stomach infection"-presumably Helicobacter pylori.  She tells me that she has been treated for this.  She moved to [place] about 6 years ago.  Complaints today include intermittent reflux symptoms, bloating, and intermittent solid food dysphagia.  She does take lansoprazole 30 mg daily.  No weight loss.  Weight gain reported.  She also has occasional nausea.  Next, postprandial abdominal cramping with diarrhea.  She has had this for years.  Her daughter has Crohn's disease.  She is not have colonoscopy.  No family history of colon cancer.  She states that she does have swollen hemorrhoids but no rectal bleeding.  Review of outside laboratories from April 02, 2019 finds normal basic metabolic panel.  CBC with mild anemia.  Hemoglobin 11.1.  MCV 97.9.  Liver tests in January 2020 were normal.  Review of x-ray file shows no relevant abnormalities.  She is status post cholecystectomy.  REVIEW OF SYSTEMS:  All non-GI ROS negative unless otherwise stated in the HPI except for urinary leakage, voice change, depression, sinus and allergies  Past Medical History:  Diagnosis Date  . Anxiety    self reported  . Depression    self reported  . Hypertension   . Obesity   . Peri-menopause 08/23/2014  . Screening for STD (sexually transmitted disease) 08/23/2014    Past Surgical History:  Procedure Laterality Date  . BREAST REDUCTION SURGERY    . CESAREAN SECTION    . CHOLECYSTECTOMY    . TUBAL LIGATION      Social History Veda Atcheson  reports that she has never  smoked. She has never used smokeless tobacco. She reports that she does not drink alcohol or use drugs.  family history includes Arthritis in her mother; Diabetes in her brother, mother, sister, sister, and another family member; Hyperlipidemia in her mother; Hypertension in her brother, brother, mother, sister, sister, sister, sister, and sister; Lupus in her sister; Other in her brother, brother, and daughter.  Allergies  Allergen Reactions  . Bactrim [Sulfamethoxazole-Trimethoprim] Other (See Comments)    Chest pain       PHYSICAL EXAMINATION: Vital signs: BP (!) 152/80   Pulse 84   Temp 97.9 F (36.6 C)   Ht 5\' 6"  (1.676 m)   Wt (!) 340 lb 6 oz (154.4 kg)   SpO2 97%   BMI 54.94 kg/m   Constitutional: generally well-appearing, no acute distress Psychiatric: alert and oriented x3, cooperative Eyes: extraocular movements intact, anicteric, conjunctiva pink Mouth: oral pharynx moist, no lesions Neck: supple no lymphadenopathy Cardiovascular: heart regular rate and rhythm, no murmur Lungs: clear to auscultation bilaterally Abdomen: soft, nontender, nondistended, no obvious ascites, no peritoneal signs, normal bowel sounds, no organomegaly Rectal: Deferred until colonoscopy Extremities: no clubbing, cyanosis, or lower extremity edema bilaterally Skin: no lesions on visible extremities Neuro: No focal deficits.  Cranial nerves intact  ASSESSMENT:  1.  Chronic GERD.  Ongoing 2.  Intermittent solid food dysphagia.  Rule out peptic stricture 3.  Postprandial abdominal cramping with urgency and loose stools consistent with diarrhea predominant irritable bowel syndrome 4.  Colon cancer screening.  Baseline risk 5.  Morbid obesity with BMI greater than 50  PLAN:  1.  Reflux precautions with attention to weight loss 2.  Continue PPI daily 3.  Schedule upper endoscopy with possible esophageal dilation.  The patient is high risk due to her obesity.  This needs to be done at the  hospital with monitored anesthesia care due to her BMI.The nature of the procedure, as well as the risks, benefits, and alternatives were carefully and thoroughly reviewed with the patient. Ample time for discussion and questions allowed. The patient understood, was satisfied, and agreed to proceed. 4.  Schedule colonoscopy for colon cancer screening and to evaluate lower abdominal complaints, including loose stools.  The patient is high risk due to her obesity.  This needs to be done at the hospital with monitored anesthesia care due to her BMI.The nature of the procedure, as well as the risks, benefits, and alternatives were carefully and thoroughly reviewed with the patient. Ample time for discussion and questions allowed. The patient understood, was satisfied, and agreed to proceed.

## 2019-06-16 NOTE — Patient Instructions (Signed)
You have been scheduled for an endoscopy and colonoscopy. Please follow the written instructions given to you at your visit today. Please pick up your prep supplies at the pharmacy within the next 1-3 days. If you use inhalers (even only as needed), please bring them with you on the day of your procedure.  Due to recent COVID-19 restrictions implemented by our local and state authorities and in an effort to keep both patients and staff as safe as possible, our hospital system now requires COVID-19 testing prior to any scheduled hospital procedure. Please go to our [facility] location drive thru testing site ([address], [place], [place] [address]) on 07/15/2019 at  9:40am. There will be multiple testing areas, the first checkpoint being for pre-procedure/surgery testing. Get into the right (yellow) lane that leads to the PAT testing team. You will not be billed at the time of testing but may receive a bill later depending on your insurance. The approximate cost of the test is $100. You must agree to quarantine from the time of your testing until the procedure date on 07/19/2019 . This should include staying at home with ONLY the people you live with. Avoid take-out, grocery store shopping or leaving the house for any non-emergent reason. Failure to have your COVID-19 test done on the date and time you have been scheduled will result in cancellation of procedure. Please call our office at [phone] if you have any questions.

## 2019-06-17 ENCOUNTER — Ambulatory Visit: Payer: Self-pay | Admitting: Physical Therapy

## 2019-06-20 ENCOUNTER — Ambulatory Visit: Payer: Self-pay | Admitting: Physical Therapy

## 2019-06-20 MED FILL — LANSOPRAZOLE DR 30 MG CAPSU: 30 | 30 days supply | Qty: 30 | Fill #1

## 2019-06-22 ENCOUNTER — Encounter: Payer: Medicaid Other | Admitting: Physical Therapy

## 2019-07-04 ENCOUNTER — Ambulatory Visit: Payer: No Typology Code available for payment source | Admitting: Podiatry

## 2019-07-06 ENCOUNTER — Encounter: Payer: Medicaid Other | Admitting: Obstetrics and Gynecology

## 2019-07-11 ENCOUNTER — Telehealth: Payer: No Typology Code available for payment source | Admitting: Podiatry

## 2019-07-11 DIAGNOSIS — M722 Plantar fascial fibromatosis: Secondary | ICD-10-CM

## 2019-07-11 MED ORDER — MELOXICAM 15 MG PO TABS: 15.0000 mg | ORAL_TABLET | Freq: Every day | ORAL | 0 refills | Status: DC

## 2019-07-12 MED FILL — MELOXICAM 15 MG TABLET: 15 | 14 days supply | Qty: 14 | Fill #0

## 2019-07-14 ENCOUNTER — Telehealth: Payer: Self-pay | Admitting: Internal Medicine

## 2019-07-14 ENCOUNTER — Telehealth: Payer: Self-pay | Admitting: Nurse Practitioner

## 2019-07-14 NOTE — Telephone Encounter (Signed)
Patient is calling needing to reschedule procedure on 07/19/19 at [facility] due to personal reasons

## 2019-07-14 NOTE — Telephone Encounter (Signed)
Patient called about some test she took.

## 2019-07-15 ENCOUNTER — Other Ambulatory Visit (HOSPITAL_COMMUNITY): Admission: RE | Admit: 2019-07-15 | Payer: Medicaid Other | Source: Ambulatory Visit

## 2019-07-15 ENCOUNTER — Other Ambulatory Visit (HOSPITAL_COMMUNITY): Payer: Medicaid Other

## 2019-07-15 NOTE — Telephone Encounter (Signed)
Patient states she wants to cancel her procedures and reschedule for a later date. Pt states she is afraid of the covid test. Explained the test has to be done prior to any procedures and we did not have any idea when the testing would stop. Let pt know we will have to call her back to reschedule. Appt cancelled per her request.

## 2019-07-18 NOTE — Progress Notes (Signed)
Virtual Visit via Telephone Note  I connected with Sheila Yoder on 1/11/2021at  4:45 PM EST by telephone and verified that I am speaking with the correct person using two identifiers.  Location: Patient: Home Provider: Office   I discussed the limitations, risks, security and privacy concerns of performing an evaluation and management service by telephone and the availability of in person appointments. I also discussed with the patient that there may be a patient responsible charge related to this service. The patient expressed understanding and agreed to proceed.   History of Present Illness: 54 year old female has previously been in the office for treatment of bilateral heel pain, plan fasciitis.  She did not want to come the office due to the recent pandemic.  She states that overall she is doing better but still some discomfort.  She is still doing the stretching, icing daily as well as wears supportive shoes and inserts.  She denies any recent injury otherwise no other changes.   Observations/Objective: She is describing decreased tenderness on the bottom of her foot on the area of the heel where she previously was having discomfort.  No swelling that she can appreciate.  No radiating pain or weakness.  Assessment and Plan: Plantar fasciitis  For her to continue with stretching, icing daily.  Discussed continuing with supportive shoes and inserts as well.  Refilled meloxicam for her and take this only as needed and we discussed side effects.  Follow Up Instructions: Follow-up in 4 to 6 weeks in the office or sooner if needed.    I discussed the assessment and treatment plan with the patient. The patient was provided an opportunity to ask questions and all were answered. The patient agreed with the plan and demonstrated an understanding of the instructions.   The patient was advised to call back or seek an in-person evaluation if the symptoms worsen or if the condition fails to improve  as anticipated.  I provided 8 minutes of non-face-to-face time during this encounter.   Trula Slade, DPM

## 2019-07-19 ENCOUNTER — Ambulatory Visit (HOSPITAL_COMMUNITY): Admission: RE | Admit: 2019-07-19 | Payer: Medicaid Other | Source: Home / Self Care | Admitting: Internal Medicine

## 2019-07-19 ENCOUNTER — Encounter (HOSPITAL_COMMUNITY): Admission: RE | Payer: Self-pay | Source: Home / Self Care

## 2019-07-19 SURGERY — ESOPHAGOGASTRODUODENOSCOPY (EGD) WITH PROPOFOL
Anesthesia: Monitor Anesthesia Care

## 2019-07-25 ENCOUNTER — Other Ambulatory Visit: Payer: Self-pay

## 2019-07-25 ENCOUNTER — Ambulatory Visit: Payer: Self-pay | Attending: Nurse Practitioner | Admitting: Nurse Practitioner

## 2019-07-25 ENCOUNTER — Encounter: Payer: Self-pay | Admitting: Nurse Practitioner

## 2019-07-25 DIAGNOSIS — I10 Essential (primary) hypertension: Secondary | ICD-10-CM

## 2019-07-25 DIAGNOSIS — D649 Anemia, unspecified: Secondary | ICD-10-CM

## 2019-07-25 DIAGNOSIS — Z131 Encounter for screening for diabetes mellitus: Secondary | ICD-10-CM

## 2019-07-25 DIAGNOSIS — Z13220 Encounter for screening for lipoid disorders: Secondary | ICD-10-CM

## 2019-07-25 MED ORDER — AMLODIPINE BESYLATE 10 MG PO TABS: 10.0000 mg | ORAL_TABLET | Freq: Every day | ORAL | 2 refills | Status: DC

## 2019-07-25 MED ORDER — LANSOPRAZOLE 30 MG PO CPDR: 30.0000 mg | DELAYED_RELEASE_CAPSULE | Freq: Every day | ORAL | 1 refills | Status: DC

## 2019-07-25 NOTE — Telephone Encounter (Signed)
Patient is calling to follow up on upcoming availability to schedule procedure at [facility].

## 2019-07-25 NOTE — Telephone Encounter (Signed)
Category 2

## 2019-07-25 NOTE — Telephone Encounter (Signed)
Pt calling wanting to know if she can reschedule her colon at the hospital. Per OV note it looks like colon was for cancer screening and some abd pain/loose stools. Would pts colon be considered at category 1 or 2? Or will she have to be rescheduled once the restrictions are lifted at the hospital. Please advise.

## 2019-07-25 NOTE — Progress Notes (Signed)
Virtual Visit via Telephone Note Due to national recommendations of social distancing due to [place] 19, telehealth visit is felt to be most appropriate for this patient at this time.  I discussed the limitations, risks, security and privacy concerns of performing an evaluation and management service by telephone and the availability of in person appointments. I also discussed with the patient that there may be a patient responsible charge related to this service. The patient expressed understanding and agreed to proceed.    I connected with Starleen Blue on 07/25/19  at   3:50 PM EST  EDT by telephone and verified that I am speaking with the correct person using two identifiers.   Consent I discussed the limitations, risks, security and privacy concerns of performing an evaluation and management service by telephone and the availability of in person appointments. I also discussed with the patient that there may be a patient responsible charge related to this service. The patient expressed understanding and agreed to proceed.   Location of Patient: Private Residence   Location of Provider: [place] and [place] participating in Telemedicine visit: Geryl Rankins FNP-BC [place]   She is requesting a lab test that can determine if she has a gastric ulcer. I instructed her the most accurate test is an endoscopy.  Unfortunately she canceled her endoscopy that was previously scheduled and now is waiting to hear back from GI once COVID restrictions are lifted. States she was scared to get pre op COVID tested and undergoing anesthesia so she canceled.    History of Present Illness: Telemedicine visit for: F/U  Essential Hypertension Not well controlled. She does not monitor her blood pressure at home as she does not have a device. States she had stopped taking her amlodipine 5 mg as she thought taking garlic would help to decrease her blood pressure  instead. Will refill amlodipine 10 mg today as BP has not been controlled even when she was taking amlodipine 5 mg last year. Denies chest pain, shortness of breath, palpitations, lightheadedness, dizziness, headaches or BLE edema.  BP Readings from Last 3 Encounters:  06/16/19 (!) 152/80  04/02/19 (!) 150/85  03/28/19 (!) 159/90      Past Medical History:  Diagnosis Date  . Anxiety    self reported  . Depression    self reported  . Hypertension   . Obesity   . Peri-menopause 08/23/2014  . Screening for STD (sexually transmitted disease) 08/23/2014    Past Surgical History:  Procedure Laterality Date  . BREAST REDUCTION SURGERY    . CESAREAN SECTION    . CHOLECYSTECTOMY    . TUBAL LIGATION      Family History  Problem Relation Age of Onset  . Diabetes Other        maternal nephew  . Hypertension Mother   . Diabetes Mother   . Arthritis Mother        rheumatoid  . Hyperlipidemia Mother   . Hypertension Sister   . Diabetes Sister   . Other Brother        hunting accident  . Other Daughter        crohn's disease  . Hypertension Sister   . Diabetes Sister   . Hypertension Sister   . Hypertension Sister   . Hypertension Sister   . Lupus Sister   . Other Brother        fell off building  . Diabetes Brother   .  Hypertension Brother   . Hypertension Brother   . Stomach cancer Neg Hx   . Esophageal cancer Neg Hx   . Pancreatic cancer Neg Hx   . Colon cancer Neg Hx     Social History   Socioeconomic History  . Marital status: Single    Spouse name: Not on file  . Number of children: Not on file  . Years of education: Not on file  . Highest education level: Not on file  Occupational History  . Not on file  Tobacco Use  . Smoking status: Never Smoker  . Smokeless tobacco: Never Used  Substance and Sexual Activity  . Alcohol use: No  . Drug use: No  . Sexual activity: Not Currently    Birth control/protection: Surgical    Comment: tubal  Other Topics  Concern  . Not on file  Social History Narrative  . Not on file   Social Determinants of Health   Financial Resource Strain:   . Difficulty of Paying Living Expenses: Not on file  Food Insecurity:   . Worried About Charity fundraiser in the Last Year: Not on file  . Ran Out of Food in the Last Year: Not on file  Transportation Needs:   . Lack of Transportation (Medical): Not on file  . Lack of Transportation (Non-Medical): Not on file  Physical Activity:   . Days of Exercise per Week: Not on file  . Minutes of Exercise per Session: Not on file  Stress:   . Feeling of Stress : Not on file  Social Connections:   . Frequency of Communication with Friends and Family: Not on file  . Frequency of Social Gatherings with Friends and Family: Not on file  . Attends Religious Services: Not on file  . Active Member of Clubs or Organizations: Not on file  . Attends Archivist Meetings: Not on file  . Marital Status: Not on file     Observations/Objective: Awake, alert and oriented x 3   Review of Systems  Constitutional: Negative for fever, malaise/fatigue and weight loss.  HENT: Negative.  Negative for nosebleeds.   Eyes: Negative.  Negative for blurred vision, double vision and photophobia.  Respiratory: Negative.  Negative for cough and shortness of breath.   Cardiovascular: Negative.  Negative for chest pain, palpitations and leg swelling.  Gastrointestinal: Positive for heartburn. Negative for nausea and vomiting.  Musculoskeletal: Negative.  Negative for myalgias.  Neurological: Negative.  Negative for dizziness, focal weakness, seizures and headaches.  Psychiatric/Behavioral: Negative.  Negative for suicidal ideas.    Assessment and Plan: Dolline was seen today for medication refill.  Diagnoses and all orders for this visit:  Essential hypertension  Anemia, unspecified type -     CBC; Future -     Iron, TIBC and Ferritin Panel; Future  Encounter for  screening for diabetes mellitus -     Hemoglobin A1c; Future  Lipid screening -     Lipid panel; Future  Other orders -     lansoprazole (PREVACID) 30 MG capsule; Take 1 capsule (30 mg total) by mouth daily at 12 noon. -     amLODipine (NORVASC) 10 MG tablet; Take 1 tablet (10 mg total) by mouth daily.     Follow Up Instructions Return in about 2 weeks (around 08/08/2019) for BP recheck.     I discussed the assessment and treatment plan with the patient. The patient was provided an opportunity to ask questions and all were answered.  The patient agreed with the plan and demonstrated an understanding of the instructions.   The patient was advised to call back or seek an in-person evaluation if the symptoms worsen or if the condition fails to improve as anticipated.  I provided 15 minutes of non-face-to-face time during this encounter including median intraservice time, reviewing previous notes, labs, imaging, medications and explaining diagnosis and management.  Gildardo Pounds, FNP-BC

## 2019-07-26 ENCOUNTER — Other Ambulatory Visit: Payer: Self-pay

## 2019-07-26 ENCOUNTER — Telehealth: Payer: Self-pay | Admitting: Internal Medicine

## 2019-07-26 DIAGNOSIS — R1310 Dysphagia, unspecified: Secondary | ICD-10-CM

## 2019-07-26 DIAGNOSIS — R1030 Lower abdominal pain, unspecified: Secondary | ICD-10-CM

## 2019-07-26 DIAGNOSIS — R195 Other fecal abnormalities: Secondary | ICD-10-CM

## 2019-07-26 DIAGNOSIS — K219 Gastro-esophageal reflux disease without esophagitis: Secondary | ICD-10-CM

## 2019-07-26 MED FILL — AMLODIPINE BESYLATE 10 MG T: 10 | 30 days supply | Qty: 30 | Fill #0

## 2019-07-26 MED FILL — ?LANSOPRAZOLE DR 30MG CAPSU: 30 | 30 days supply | Qty: 30 | Fill #0

## 2019-07-26 NOTE — Telephone Encounter (Signed)
Patient is calling you back in reference to scheduling procedure at [facility] she said the15th would be ok with her.

## 2019-07-26 NOTE — Telephone Encounter (Signed)
Pt. Concerns were addressed by PCP.

## 2019-07-26 NOTE — Telephone Encounter (Signed)
Dr. Blanch Media next hospital date is 2/15. Left message for pt to call back regarding scheduling.

## 2019-07-26 NOTE — Telephone Encounter (Signed)
Pt scheduled for previsit 2/8@9am , covid screen 2/11@9am . ECL scheduled at [facility] 08/15/19 at 8:30am. Pt to arrive there at 7am. Pt aware of appts.

## 2019-07-26 NOTE — Telephone Encounter (Signed)
See additional phone note. 

## 2019-07-29 ENCOUNTER — Other Ambulatory Visit: Payer: Medicaid Other

## 2019-08-08 ENCOUNTER — Other Ambulatory Visit: Payer: Self-pay

## 2019-08-08 ENCOUNTER — Ambulatory Visit (AMBULATORY_SURGERY_CENTER): Payer: Medicare Other | Admitting: *Deleted

## 2019-08-08 VITALS — Temp 98.1°F | Ht 66.0 in | Wt 342.6 lb

## 2019-08-08 DIAGNOSIS — K589 Irritable bowel syndrome without diarrhea: Secondary | ICD-10-CM

## 2019-08-08 DIAGNOSIS — Z8619 Personal history of other infectious and parasitic diseases: Secondary | ICD-10-CM

## 2019-08-08 DIAGNOSIS — R1310 Dysphagia, unspecified: Secondary | ICD-10-CM

## 2019-08-08 DIAGNOSIS — Z1211 Encounter for screening for malignant neoplasm of colon: Secondary | ICD-10-CM

## 2019-08-08 DIAGNOSIS — K219 Gastro-esophageal reflux disease without esophagitis: Secondary | ICD-10-CM

## 2019-08-08 DIAGNOSIS — Z01818 Encounter for other preprocedural examination: Secondary | ICD-10-CM

## 2019-08-08 NOTE — Progress Notes (Signed)
Slow waking up with anesthesia per pt.   Patient is here in-person for PV. Patient denies any allergies to eggs or soy. Patient denies any problems with anesthesia/sedation. Patient denies any oxygen use at home. Patient denies taking any diet/weight loss medications or blood thinners. Patient is not being treated for MRSA or C-diff. EMMI education assisgned to the patient for the procedure, this was explained and instructions given to patient. COVID-19 screening test is on 2/11, the pt is aware. Pt is aware that care partner will wait in the car during procedure; if they feel like they will be too hot or cold to wait in the car; they may wait in the 4 th floor lobby. Patient is aware to bring only one care partner. We want them to wear a mask (we do not have any that we can provide them), practice social distancing, and we will check their temperatures when they get here.  I did remind the patient that their care partner needs to stay in the parking lot the entire time and have a cell phone available, we will call them when the pt is ready for discharge. Patient will wear mask into building.    Plenvu sample patient has at home-per pt

## 2019-08-09 ENCOUNTER — Encounter (HOSPITAL_COMMUNITY): Payer: Self-pay | Admitting: Internal Medicine

## 2019-08-09 NOTE — Progress Notes (Signed)
Attempted to obtain medical history via telephone, unable to reach at this time. I left a voicemail to return pre surgical testing department's phone call.  

## 2019-08-11 ENCOUNTER — Other Ambulatory Visit (HOSPITAL_COMMUNITY)
Admission: RE | Admit: 2019-08-11 | Discharge: 2019-08-11 | Disposition: A | Discharge status: Home / Self Care | Payer: Medicare Other | Source: Ambulatory Visit | Attending: Internal Medicine | Admitting: Internal Medicine

## 2019-08-11 ENCOUNTER — Telehealth: Payer: Self-pay | Admitting: Nurse Practitioner

## 2019-08-11 DIAGNOSIS — Z01812 Encounter for preprocedural laboratory examination: Secondary | ICD-10-CM | POA: Insufficient documentation

## 2019-08-11 DIAGNOSIS — R1310 Dysphagia, unspecified: Secondary | ICD-10-CM | POA: Diagnosis not present

## 2019-08-11 DIAGNOSIS — K589 Irritable bowel syndrome without diarrhea: Secondary | ICD-10-CM | POA: Insufficient documentation

## 2019-08-11 DIAGNOSIS — K219 Gastro-esophageal reflux disease without esophagitis: Secondary | ICD-10-CM | POA: Insufficient documentation

## 2019-08-11 DIAGNOSIS — Z20822 Contact with and (suspected) exposure to covid-19: Secondary | ICD-10-CM | POA: Insufficient documentation

## 2019-08-11 DIAGNOSIS — Z8619 Personal history of other infectious and parasitic diseases: Secondary | ICD-10-CM | POA: Insufficient documentation

## 2019-08-11 LAB — SARS CORONAVIRUS 2 (TAT 6-24 HRS): SARS Coronavirus 2: NEGATIVE

## 2019-08-11 NOTE — Telephone Encounter (Signed)
I called back the Pt, LVM informed her that she has Medicare at this time, for this reason she does not qualified for the CAFA and the OC programs since these program are for person that has any kind of insurance

## 2019-08-11 NOTE — Telephone Encounter (Signed)
Patient called wanting to see if she was still eligible for cone assistance. She wanted to check her status. Please fu at your earliest convenience.

## 2019-08-14 NOTE — Anesthesia Preprocedure Evaluation (Addendum)
Anesthesia Evaluation  Patient identified by MRN, date of birth, ID band Patient awake    Reviewed: Allergy & Precautions, NPO status , Patient's Chart, lab work & pertinent test results  History of Anesthesia Complications Negative for: history of anesthetic complications  Airway Mallampati: II  TM Distance: >3 FB Neck ROM: Full    Dental  (+) Dental Advisory Given, Teeth Intact   Pulmonary   Probable OSA    Pulmonary exam normal        Cardiovascular hypertension, Pt. on medications Normal cardiovascular exam     Neuro/Psych PSYCHIATRIC DISORDERS Anxiety Depression negative neurological ROS     GI/Hepatic Neg liver ROS, GERD  Medicated and Controlled,  Endo/Other  Morbid obesity  Renal/GU negative Renal ROS     Musculoskeletal negative musculoskeletal ROS (+)   Abdominal (+) + obese,   Peds  Hematology negative hematology ROS (+)   Anesthesia Other Findings Covid neg 08/11/19   Reproductive/Obstetrics  S/p tubal ligation                             Anesthesia Physical Anesthesia Plan  ASA: III  Anesthesia Plan: MAC   Post-op Pain Management:    Induction: Intravenous  PONV Risk Score and Plan: 2 and Propofol infusion and Treatment may vary due to age or medical condition  Airway Management Planned: Nasal Cannula and Natural Airway  Additional Equipment: None  Intra-op Plan:   Post-operative Plan:   Informed Consent: I have reviewed the patients History and Physical, chart, labs and discussed the procedure including the risks, benefits and alternatives for the proposed anesthesia with the patient or authorized representative who has indicated his/her understanding and acceptance.       Plan Discussed with: CRNA and Anesthesiologist  Anesthesia Plan Comments:        Anesthesia Quick Evaluation

## 2019-08-15 ENCOUNTER — Ambulatory Visit (HOSPITAL_COMMUNITY): Payer: Medicare Other | Admitting: Anesthesiology

## 2019-08-15 ENCOUNTER — Encounter (HOSPITAL_COMMUNITY): Payer: Self-pay | Admitting: Internal Medicine

## 2019-08-15 ENCOUNTER — Encounter (HOSPITAL_COMMUNITY)
Admission: RE | Disposition: A | Discharge status: Home / Self Care | Payer: Self-pay | Source: Home / Self Care | Attending: Internal Medicine

## 2019-08-15 ENCOUNTER — Other Ambulatory Visit: Payer: Self-pay

## 2019-08-15 ENCOUNTER — Ambulatory Visit: Payer: Medicaid Other | Admitting: Nurse Practitioner

## 2019-08-15 ENCOUNTER — Ambulatory Visit (HOSPITAL_COMMUNITY)
Admission: RE | Admit: 2019-08-15 | Discharge: 2019-08-15 | Disposition: A | Discharge status: Home / Self Care | Payer: Medicare Other | Attending: Internal Medicine | Admitting: Internal Medicine

## 2019-08-15 DIAGNOSIS — Z6841 Body Mass Index (BMI) 40.0 and over, adult: Secondary | ICD-10-CM | POA: Diagnosis not present

## 2019-08-15 DIAGNOSIS — Z9049 Acquired absence of other specified parts of digestive tract: Secondary | ICD-10-CM | POA: Insufficient documentation

## 2019-08-15 DIAGNOSIS — K5730 Diverticulosis of large intestine without perforation or abscess without bleeding: Secondary | ICD-10-CM | POA: Diagnosis not present

## 2019-08-15 DIAGNOSIS — D120 Benign neoplasm of cecum: Secondary | ICD-10-CM | POA: Diagnosis not present

## 2019-08-15 DIAGNOSIS — I10 Essential (primary) hypertension: Secondary | ICD-10-CM | POA: Insufficient documentation

## 2019-08-15 DIAGNOSIS — D649 Anemia, unspecified: Secondary | ICD-10-CM | POA: Diagnosis not present

## 2019-08-15 DIAGNOSIS — Z8489 Family history of other specified conditions: Secondary | ICD-10-CM | POA: Diagnosis not present

## 2019-08-15 DIAGNOSIS — K222 Esophageal obstruction: Secondary | ICD-10-CM | POA: Insufficient documentation

## 2019-08-15 DIAGNOSIS — K635 Polyp of colon: Secondary | ICD-10-CM | POA: Diagnosis not present

## 2019-08-15 DIAGNOSIS — F329 Major depressive disorder, single episode, unspecified: Secondary | ICD-10-CM | POA: Insufficient documentation

## 2019-08-15 DIAGNOSIS — F419 Anxiety disorder, unspecified: Secondary | ICD-10-CM | POA: Diagnosis not present

## 2019-08-15 DIAGNOSIS — R1310 Dysphagia, unspecified: Secondary | ICD-10-CM

## 2019-08-15 DIAGNOSIS — Z833 Family history of diabetes mellitus: Secondary | ICD-10-CM | POA: Insufficient documentation

## 2019-08-15 DIAGNOSIS — K219 Gastro-esophageal reflux disease without esophagitis: Secondary | ICD-10-CM

## 2019-08-15 DIAGNOSIS — Z8249 Family history of ischemic heart disease and other diseases of the circulatory system: Secondary | ICD-10-CM | POA: Insufficient documentation

## 2019-08-15 DIAGNOSIS — Z8261 Family history of arthritis: Secondary | ICD-10-CM | POA: Diagnosis not present

## 2019-08-15 DIAGNOSIS — Z881 Allergy status to other antibiotic agents status: Secondary | ICD-10-CM | POA: Insufficient documentation

## 2019-08-15 DIAGNOSIS — Z1211 Encounter for screening for malignant neoplasm of colon: Secondary | ICD-10-CM | POA: Diagnosis not present

## 2019-08-15 DIAGNOSIS — R197 Diarrhea, unspecified: Secondary | ICD-10-CM

## 2019-08-15 DIAGNOSIS — R195 Other fecal abnormalities: Secondary | ICD-10-CM

## 2019-08-15 DIAGNOSIS — D122 Benign neoplasm of ascending colon: Secondary | ICD-10-CM

## 2019-08-15 DIAGNOSIS — R1030 Lower abdominal pain, unspecified: Secondary | ICD-10-CM

## 2019-08-15 HISTORY — PX: ESOPHAGOGASTRODUODENOSCOPY (EGD) WITH PROPOFOL: SHX5813

## 2019-08-15 HISTORY — DX: Personal history of other infectious and parasitic diseases: Z86.19

## 2019-08-15 HISTORY — PX: COLONOSCOPY WITH PROPOFOL: SHX5780

## 2019-08-15 HISTORY — DX: Unspecified hemorrhoids: K64.9

## 2019-08-15 HISTORY — PX: BIOPSY: SHX5522

## 2019-08-15 HISTORY — DX: Gastro-esophageal reflux disease without esophagitis: K21.9

## 2019-08-15 HISTORY — DX: Insomnia, unspecified: G47.00

## 2019-08-15 HISTORY — PX: POLYPECTOMY: SHX5525

## 2019-08-15 HISTORY — PX: BALLOON DILATION: SHX5330

## 2019-08-15 LAB — CLOTEST (H. PYLORI), BIOPSY: Helicobacter screen: POSITIVE — AB

## 2019-08-15 SURGERY — ESOPHAGOGASTRODUODENOSCOPY (EGD) WITH PROPOFOL
Anesthesia: Monitor Anesthesia Care

## 2019-08-15 MED ORDER — PHENYLEPHRINE 40 MCG/ML (10ML) SYRINGE FOR IV PUSH (FOR BLOOD PRESSURE SUPPORT)
PREFILLED_SYRINGE | INTRAVENOUS | Status: DC | PRN
  Administered 2019-08-15 (×2): 80 ug via INTRAVENOUS
  Administered 2019-08-15 (×2): 100 ug via INTRAVENOUS

## 2019-08-15 MED ORDER — SODIUM CHLORIDE 0.9 % IV SOLN: INTRAVENOUS | Status: DC

## 2019-08-15 MED ORDER — PROPOFOL 10 MG/ML IV BOLUS
INTRAVENOUS | Status: AC
  Filled 2019-08-15: qty 40

## 2019-08-15 MED ORDER — PROPOFOL 10 MG/ML IV BOLUS
INTRAVENOUS | Status: DC | PRN
  Administered 2019-08-15: 20 mg via INTRAVENOUS
  Administered 2019-08-15: 40 mg via INTRAVENOUS
  Administered 2019-08-15 (×2): 20 mg via INTRAVENOUS

## 2019-08-15 MED ORDER — PROPOFOL 500 MG/50ML IV EMUL
INTRAVENOUS | Status: DC | PRN
  Administered 2019-08-15: 95 ug/kg/min via INTRAVENOUS

## 2019-08-15 MED ORDER — PROPOFOL 500 MG/50ML IV EMUL
INTRAVENOUS | Status: AC
  Filled 2019-08-15: qty 50

## 2019-08-15 MED ORDER — LACTATED RINGERS IV SOLN
INTRAVENOUS | Status: AC | PRN
  Administered 2019-08-15: 1000 mL via INTRAVENOUS

## 2019-08-15 SURGICAL SUPPLY — 24 items

## 2019-08-15 NOTE — Anesthesia Postprocedure Evaluation (Signed)
Anesthesia Post Note  Patient: Monaye Blackie  Procedure(s) Performed: ESOPHAGOGASTRODUODENOSCOPY (EGD) WITH PROPOFOL (N/A ) COLONOSCOPY WITH PROPOFOL (N/A ) BIOPSY POLYPECTOMY BALLOON DILATION (N/A )     Patient location during evaluation: PACU Anesthesia Type: MAC Level of consciousness: awake and alert Pain management: pain level controlled Vital Signs Assessment: post-procedure vital signs reviewed and stable Respiratory status: spontaneous breathing, nonlabored ventilation and respiratory function stable Cardiovascular status: stable and blood pressure returned to baseline Anesthetic complications: no    Last Vitals:  Vitals:   08/15/19 1000 08/15/19 1010  BP: 121/84 (!) 155/83  Pulse: 79 77  Resp: 17 20  Temp:    SpO2: 100% 100%    Last Pain:  Vitals:   08/15/19 1010  TempSrc:   PainSc: 0-No pain                 Audry Pili

## 2019-08-15 NOTE — H&P (Signed)
HISTORY OF PRESENT ILLNESS:  Sheila Yoder is a 54 y.o. female with morbid obesity, hypertension, and anxiety/depression who was sent today by the health and wellness clinic regarding multiple GI complaints and the need for endoscopic procedures.  Patient tells me that she has a history of GERD complicated by peptic stricture for which she underwent esophageal dilation in [place] around 2013 (no details).  Also tells me that she has had a "stomach infection"-presumably Helicobacter pylori.  She tells me that she has been treated for this.  She moved to [place] about 6 years ago.  Complaints today include intermittent reflux symptoms, bloating, and intermittent solid food dysphagia.  She does take lansoprazole 30 mg daily.  No weight loss.  Weight gain reported.  She also has occasional nausea.  Next, postprandial abdominal cramping with diarrhea.  She has had this for years.  Her daughter has Crohn's disease.  She is not have colonoscopy.  No family history of colon cancer.  She states that she does have swollen hemorrhoids but no rectal bleeding.  Review of outside laboratories from April 02, 2019 finds normal basic metabolic panel.  CBC with mild anemia.  Hemoglobin 11.1.  MCV 97.9.  Liver tests in January 2020 were normal.  Review of x-ray file shows no relevant abnormalities.  She is status post cholecystectomy.  REVIEW OF SYSTEMS:  All non-GI ROS negative unless otherwise stated in the HPI except for urinary leakage, voice change, depression, sinus and allergies      Past Medical History:  Diagnosis Date  . Anxiety    self reported  . Depression    self reported  . Hypertension   . Obesity   . Peri-menopause 08/23/2014  . Screening for STD (sexually transmitted disease) 08/23/2014         Past Surgical History:  Procedure Laterality Date  . BREAST REDUCTION SURGERY    . CESAREAN SECTION    . CHOLECYSTECTOMY    . TUBAL LIGATION      Social  History Sheila Yoder  reports that she has never smoked. She has never used smokeless tobacco. She reports that she does not drink alcohol or use drugs.  family history includes Arthritis in her mother; Diabetes in her brother, mother, sister, sister, and another family member; Hyperlipidemia in her mother; Hypertension in her brother, brother, mother, sister, sister, sister, sister, and sister; Lupus in her sister; Other in her brother, brother, and daughter.       Allergies  Allergen Reactions  . Bactrim [Sulfamethoxazole-Trimethoprim] Other (See Comments)    Chest pain       PHYSICAL EXAMINATION: Vital signs: BP (!) 152/80   Pulse 84   Temp 97.9 F (36.6 C)   Ht 5\' 6"  (1.676 m)   Wt (!) 340 lb 6 oz (154.4 kg)   SpO2 97%   BMI 54.94 kg/m   Constitutional: generally well-appearing, no acute distress Psychiatric: alert and oriented x3, cooperative Eyes: extraocular movements intact, anicteric, conjunctiva pink Mouth: oral pharynx moist, no lesions Neck: supple no lymphadenopathy Cardiovascular: heart regular rate and rhythm, no murmur Lungs: clear to auscultation bilaterally Abdomen: soft, nontender, nondistended, no obvious ascites, no peritoneal signs, normal bowel sounds, no organomegaly Rectal: Deferred until colonoscopy Extremities: no clubbing, cyanosis, or lower extremity edema bilaterally Skin: no lesions on visible extremities Neuro: No focal deficits.  Cranial nerves intact  ASSESSMENT:  1.  Chronic GERD.  Ongoing 2.  Intermittent solid food dysphagia.  Rule out peptic stricture 3.  Postprandial abdominal  cramping with urgency and loose stools consistent with diarrhea predominant irritable bowel syndrome 4.  Colon cancer screening.  Baseline risk 5.  Morbid obesity with BMI greater than 50  PLAN:  1.  Reflux precautions with attention to weight loss 2.  Continue PPI daily 3.  Schedule upper endoscopy with possible esophageal dilation.  The patient  is high risk due to her obesity.  This needs to be done at the hospital with monitored anesthesia care due to her BMI.The nature of the procedure, as well as the risks, benefits, and alternatives were carefully and thoroughly reviewed with the patient. Ample time for discussion and questions allowed. The patient understood, was satisfied, and agreed to proceed. 4.  Schedule colonoscopy for colon cancer screening and to evaluate lower abdominal complaints, including loose stools.  The patient is high risk due to her obesity.  This needs to be done at the hospital with monitored anesthesia care due to her BMI.The nature of the procedure, as well as the risks, benefits, and alternatives were carefully and thoroughly reviewed with the patient. Ample time for discussion and questions allowed. The patient understood, was satisfied, and agreed to proceed.  No interval problems since office visit.  All questions answered prior to his procedures.

## 2019-08-15 NOTE — Transfer of Care (Signed)
Immediate Anesthesia Transfer of Care Note  Patient: Sheila Yoder  Procedure(s) Performed: Procedure(s): ESOPHAGOGASTRODUODENOSCOPY (EGD) WITH PROPOFOL (N/A) COLONOSCOPY WITH PROPOFOL (N/A) BIOPSY POLYPECTOMY BALLOON DILATION (N/A)  Patient Location: PACU  Anesthesia Type:MAC  Level of Consciousness:  sedated, patient cooperative and responds to stimulation  Airway & Oxygen Therapy:Patient Spontanous Breathing and Patient connected to face mask oxgen  Post-op Assessment:  Report given to PACU RN and Post -op Vital signs reviewed and stable  Post vital signs:  Reviewed and stable  Last Vitals:  Vitals:   08/15/19 0744 08/15/19 0952  BP: 135/90 (!) 104/52  Pulse:  83  Resp: 20 (!) 23  Temp: 36.8 C 36.4 C  SpO2: 75% 88%    Complications: No apparent anesthesia complications

## 2019-08-15 NOTE — Op Note (Addendum)
[facility] Patient Name: Sheila Yoder Procedure Date: 08/15/2019 MRN: [id number] Attending MD: Docia Chuck. Henrene Pastor , MD Date of Birth: [birth year] CSN: [id number] Age: 54 Admit Type: Outpatient Procedure:                Colonoscopy with cold snare polypectomy x 2; with                            biopsies Indications:              Screening for colorectal malignant neoplasm,                            Incidental abdominal pain noted, Incidental                            diarrhea noted Providers:                Docia Chuck. Henrene Pastor, MD, Cleda Daub, RN, Kary Kos,                            RN, Theodora Blow, Technician Referring MD:             Geryl Rankins, NP Medicines:                Monitored Anesthesia Care Complications:            No immediate complications. Estimated blood loss:                            None. Estimated Blood Loss:     Estimated blood loss: none. Procedure:                Pre-Anesthesia Assessment:                           - Prior to the procedure, a History and Physical                            was performed, and patient medications and                            allergies were reviewed. The patient's tolerance of                            previous anesthesia was also reviewed. The risks                            and benefits of the procedure and the sedation                            options and risks were discussed with the patient.                            All questions were answered, and informed consent                            was obtained. Prior  Anticoagulants: The patient has                            taken no previous anticoagulant or antiplatelet                            agents. ASA Grade Assessment: III - A patient with                            severe systemic disease. After reviewing the risks                            and benefits, the patient was deemed in                            satisfactory condition to undergo the  procedure.                           After obtaining informed consent, the colonoscope                            was passed under direct vision. Throughout the                            procedure, the patient's blood pressure, pulse, and                            oxygen saturations were monitored continuously. The                            [id number]) Olympus colonoscope was                            introduced through the anus and advanced to the the                            cecum, identified by appendiceal orifice and                            ileocecal valve. The ileocecal valve, appendiceal                            orifice, and rectum were photographed. The quality                            of the bowel preparation was excellent. The                            colonoscopy was performed without difficulty. The                            patient tolerated the procedure well. The bowel  preparation used was SUPREP via split dose                            instruction. Scope In: 9:02:52 AM Scope Out: 9:25:31 AM Scope Withdrawal Time: 0 hours 11 minutes 38 seconds  Total Procedure Duration: 0 hours 22 minutes 39 seconds  Findings:      Two polyps were found in the cecum and ileocecal valve. The polyps were       4 to 8 mm in size. These polyps were removed with a cold snare.       Resection and retrieval were complete.      Many small and large-mouthed diverticula were found in the entire colon.      The entire examined colon appeared otherwise normal on direct and       retroflexion views. Biopsies for histology were taken with a cold       forceps from the entire colon for evaluation of microscopic colitis. Impression:               - Two 4 to 8 mm polyps in the cecum and at the                            ileocecal valve, removed with a cold snare.                            Resected and retrieved.                           - Diverticulosis  in the entire examined colon.                           - The entire examined colon is otherwise normal on                            direct and retroflexion views. Moderate Sedation:      none Recommendation:           - Repeat colonoscopy in 5 years for surveillance.                           - Patient has a contact number available for                            emergencies. The signs and symptoms of potential                            delayed complications were discussed with the                            patient. Return to normal activities tomorrow.                            Written discharge instructions were provided to the                            patient.                           -  Resume previous diet.                           - Continue present medications.                           - Await pathology results.                           -EGD today. Please see report regarding findings                            and final recommendations Procedure Code(s):        --- Professional ---                           [phone], Colonoscopy, flexible; with removal of                            tumor(s), polyp(s), or other lesion(s) by snare                            technique                           [id number], 59, Colonoscopy, flexible; with biopsy,                            single or multiple Diagnosis Code(s):        --- Professional ---                           Z12.11, Encounter for screening for malignant                            neoplasm of colon                           K63.5, Polyp of colon                           K57.30, Diverticulosis of large intestine without                            perforation or abscess without bleeding CPT copyright 2019 American Medical Association. All rights reserved. The codes documented in this report are preliminary and upon coder review may  be revised to meet current compliance requirements. Docia Chuck. Henrene Pastor, MD 08/15/2019 9:34:07  AM This report has been signed electronically. Number of Addenda: 0

## 2019-08-15 NOTE — Discharge Instructions (Signed)
YOU HAD AN ENDOSCOPIC PROCEDURE TODAY: Refer to the procedure report and other information in the discharge instructions given to you for any specific questions about what was found during the examination. If this information does not answer your questions, please call [place] office at [phone] to clarify.  ° °YOU SHOULD EXPECT: Some feelings of bloating in the abdomen. Passage of more gas than usual. Walking can help get rid of the air that was put into your GI tract during the procedure and reduce the bloating.. ° °DIET: Your first meal following the procedure should be a light meal and then it is ok to progress to your normal diet. A half-sandwich or bowl of soup is an example of a good first meal. Heavy or fried foods are harder to digest and may make you feel nauseous or bloated. Drink plenty of fluids but you should avoid alcoholic beverages for 24 hours. If you had a esophageal dilation, please see attached instructions for diet.   ° °ACTIVITY: Your care partner should take you home directly after the procedure. You should plan to take it easy, moving slowly for the rest of the day. You can resume normal activity the day after the procedure however YOU SHOULD NOT DRIVE, use power tools, machinery or perform tasks that involve climbing or major physical exertion for 24 hours (because of the sedation medicines used during the test).  ° °SYMPTOMS TO REPORT IMMEDIATELY: °A gastroenterologist can be reached at any hour. Please call [phone]  for any of the following symptoms:  ° °Following upper endoscopy (EGD, EUS, ERCP, esophageal dilation) °Vomiting of blood or coffee ground material  °New, significant abdominal pain  °New, significant chest pain or pain under the shoulder blades  °Painful or persistently difficult swallowing  °New shortness of breath  °Black, tarry-looking or red, bloody stools ° °FOLLOW UP:  °If any biopsies were taken you will be contacted by phone or by letter within the next 1-3  weeks. Call [phone]  if you have not heard about the biopsies in 3 weeks.  °Please also call with any specific questions about appointments or follow up tests. ° °

## 2019-08-15 NOTE — Op Note (Addendum)
[facility] Patient Name: Sheila Yoder Procedure Date: 08/15/2019 MRN: [id number] Attending MD: Docia Chuck. Henrene Pastor , MD Date of Birth: March 28, 1966 CSN: [id number] Age: 54 Admit Type: Outpatient Procedure:                Upper GI endoscopy with balloon dilation of the                            esophagus (20 mm); with biopsies Indications:              Dysphagia, GERD, history of H. pylori Providers:                Docia Chuck. Henrene Pastor, MD, Cleda Daub, RN, Kary Kos,                            RN, Theodora Blow, Technician Referring MD:             Geryl Rankins, NP Medicines:                Monitored Anesthesia Care Complications:            No immediate complications. Estimated Blood Loss:     Estimated blood loss: none. Procedure:                Pre-Anesthesia Assessment:                           - Prior to the procedure, a History and Physical                            was performed, and patient medications and                            allergies were reviewed. The patient's tolerance of                            previous anesthesia was also reviewed. The risks                            and benefits of the procedure and the sedation                            options and risks were discussed with the patient.                            All questions were answered, and informed consent                            was obtained. Prior Anticoagulants: The patient has                            taken no previous anticoagulant or antiplatelet                            agents. ASA Grade Assessment: III - A patient with  severe systemic disease. After reviewing the risks                            and benefits, the patient was deemed in                            satisfactory condition to undergo the procedure.                           After obtaining informed consent, the endoscope was                            passed under direct vision. Throughout  the                            procedure, the patient's blood pressure, pulse, and                            oxygen saturations were monitored continuously. The                            [id number]-[id number]) Olympus gastroscope was                            introduced through the mouth, and advanced to the                            second part of duodenum. The upper GI endoscopy was                            accomplished without difficulty. The patient                            tolerated the procedure well. Scope In: Scope Out: Findings:      One benign-appearing, intrinsic mild stenosis was found 39 cm from the       incisors. This stenosis measured 1.5 cm (inner diameter). A TTS dilator       was passed through the scope. Dilation with an 18-19-20 mm balloon       dilator was performed to 20 mm.      The exam of the esophagus was otherwise normal.      The stomach was normal. Biopsies were taken with a cold forceps for       Helicobacter pylori testing using CLOtest.      The examined duodenum was normal.      The cardia and gastric fundus were normal on retroflexion. Impression:               - Benign-appearing esophageal stenosis. Dilated.                           - Normal stomach. Biopsied.                           - Normal examined duodenum. Moderate Sedation:      none Recommendation:           -  Patient has a contact number available for                            emergencies. The signs and symptoms of potential                            delayed complications were discussed with the                            patient. Return to normal activities tomorrow.                            Written discharge instructions were provided to the                            patient.                           - Resume previous diet.                           - Continue present medications.                           -Follow-up biopsies                           -Make a follow-up  appointment with Dr. Henrene Pastor in his                            office to review the above results Procedure Code(s):        --- Professional ---                           [phone], Esophagogastroduodenoscopy, flexible,                            transoral; with transendoscopic balloon dilation of                            esophagus (less than 30 mm diameter) Diagnosis Code(s):        --- Professional ---                           K22.2, Esophageal obstruction                           R13.10, Dysphagia, unspecified CPT copyright 2019 American Medical Association. All rights reserved. The codes documented in this report are preliminary and upon coder review may  be revised to meet current compliance requirements. Docia Chuck. Henrene Pastor, MD 08/15/2019 9:51:05 AM This report has been signed electronically. Number of Addenda: 0

## 2019-08-16 LAB — SURGICAL PATHOLOGY

## 2019-08-17 ENCOUNTER — Encounter: Payer: Self-pay | Admitting: *Deleted

## 2019-09-01 ENCOUNTER — Encounter: Payer: Self-pay | Admitting: Obstetrics & Gynecology

## 2019-09-01 ENCOUNTER — Other Ambulatory Visit: Payer: Self-pay

## 2019-09-01 ENCOUNTER — Ambulatory Visit (INDEPENDENT_AMBULATORY_CARE_PROVIDER_SITE_OTHER): Payer: Medicare Other | Admitting: Obstetrics & Gynecology

## 2019-09-01 ENCOUNTER — Other Ambulatory Visit: Payer: Self-pay | Admitting: Obstetrics & Gynecology

## 2019-09-01 ENCOUNTER — Other Ambulatory Visit (HOSPITAL_COMMUNITY)
Admission: RE | Admit: 2019-09-01 | Discharge: 2019-09-01 | Disposition: A | Discharge status: Home / Self Care | Payer: Medicare Other | Source: Ambulatory Visit | Attending: Obstetrics & Gynecology | Admitting: Obstetrics & Gynecology

## 2019-09-01 VITALS — BP 133/83 | HR 93 | Wt 340.1 lb

## 2019-09-01 DIAGNOSIS — Z1151 Encounter for screening for human papillomavirus (HPV): Secondary | ICD-10-CM | POA: Diagnosis not present

## 2019-09-01 DIAGNOSIS — Z01419 Encounter for gynecological examination (general) (routine) without abnormal findings: Secondary | ICD-10-CM | POA: Diagnosis present

## 2019-09-01 DIAGNOSIS — N938 Other specified abnormal uterine and vaginal bleeding: Secondary | ICD-10-CM | POA: Diagnosis present

## 2019-09-01 DIAGNOSIS — Z0000 Encounter for general adult medical examination without abnormal findings: Secondary | ICD-10-CM | POA: Diagnosis not present

## 2019-09-01 DIAGNOSIS — N950 Postmenopausal bleeding: Secondary | ICD-10-CM | POA: Diagnosis not present

## 2019-09-01 MED ORDER — MISOPROSTOL 200 MCG PO TABS: ORAL_TABLET | ORAL | 0 refills | Status: DC

## 2019-09-01 NOTE — Progress Notes (Signed)
DNKA Gyn u/s ordered

## 2019-09-01 NOTE — Progress Notes (Signed)
   Subjective:    Patient ID: Sheila Yoder, female    DOB: 1965/12/25, 54 y.o.   MRN: [id number]  HPI 54 yo single P2 (61 and 26 yo kids, 1 grand) here today with the issue of PMB, started 10/20. She has not bled since then. Her LNMP was in 2015. But she did have an occasion of PMB in 2019.  She does not take HRT, has never taken.  Abstinent since about 2019.   Review of Systems Last pap in 2016 was normal. Last mammogram was in 2000.    Objective:   Physical Exam Breathing, conversing, and ambulating normally Well nourished, well hydrated Black female, no apparent distress Abd- benign, morbid obesity Cervix- nulliparous     Assessment & Plan:  Preventative care- check pap and mammogram PMB- schedule gyn u/s and EMBX at next visit. I prescribed cytotec to be taken the night prior to biopsy.

## 2019-09-02 LAB — TSH: TSH: 2.6 u[IU]/mL (ref 0.450–4.500)

## 2019-09-02 LAB — CYTOLOGY - PAP
Adequacy: ABSENT
Chlamydia: NEGATIVE
Comment: NEGATIVE
Comment: NEGATIVE
Comment: NORMAL
Diagnosis: NEGATIVE
High risk HPV: NEGATIVE
Neisseria Gonorrhea: NEGATIVE

## 2019-09-02 LAB — CBC
Hematocrit: 35.8 % (ref 34.0–46.6)
Hemoglobin: 11.7 g/dL (ref 11.1–15.9)
MCH: 29.8 pg (ref 26.6–33.0)
MCHC: 32.7 g/dL (ref 31.5–35.7)
MCV: 91 fL (ref 79–97)
Platelets: 293 10*3/uL (ref 150–450)
RBC: 3.93 x10E6/uL (ref 3.77–5.28)
RDW: 13.1 % (ref 11.7–15.4)
WBC: 5.4 10*3/uL (ref 3.4–10.8)

## 2019-09-07 ENCOUNTER — Ambulatory Visit (HOSPITAL_COMMUNITY): Payer: Medicare Other | Attending: Obstetrics & Gynecology

## 2019-09-13 ENCOUNTER — Telehealth: Payer: Self-pay | Admitting: Obstetrics & Gynecology

## 2019-09-13 NOTE — Telephone Encounter (Signed)
Patient is requesting a call back so she can get the understanding of what a Endo Bx is.

## 2019-09-14 ENCOUNTER — Ambulatory Visit: Payer: Medicare Other | Admitting: Obstetrics & Gynecology

## 2019-09-14 ENCOUNTER — Telehealth: Payer: Self-pay | Admitting: Family Medicine

## 2019-09-14 NOTE — Telephone Encounter (Signed)
Returned patients call. Message left for patient to call the office at her earliest convenience.

## 2019-09-14 NOTE — Progress Notes (Unsigned)
Spoke with Dr. Harolyn Rutherford about patient no show of appointment. Patient to be rescheduled per Dr. Harolyn Rutherford.

## 2019-09-14 NOTE — Telephone Encounter (Signed)
Called the patient to inform of the rescheduled appointment. Left voicemail and mailed appointment reminder letter

## 2019-09-14 NOTE — Progress Notes (Deleted)
   Patient did not show up today for her scheduled appointment.   Brennden Masten, MD, FACOG Obstetrician & Gynecologist, [facility], [facility]  

## 2019-09-15 ENCOUNTER — Telehealth: Payer: Self-pay | Admitting: *Deleted

## 2019-09-15 NOTE — Telephone Encounter (Signed)
Patient asking for results of pap smear. Call transferred to nurse. I explained her March pap was negative for malignancy ,negative for GC or hpv. She also wanted to know if the 2 appointments for our office could be scheduled on the same day. I explained she only has one appointment with [place] 5/ 3/ 21 for endometrial biopsy. But she also has mammogram scheduled 10/03/19. I explained [facility] and I gave her their address and phone number if she needs to reschedule. She  Voices understanding. Magali Bray,RN

## 2019-10-03 ENCOUNTER — Ambulatory Visit: Payer: Medicare Other

## 2019-10-06 ENCOUNTER — Other Ambulatory Visit: Payer: Self-pay | Admitting: Nurse Practitioner

## 2019-10-06 DIAGNOSIS — Z1231 Encounter for screening mammogram for malignant neoplasm of breast: Secondary | ICD-10-CM

## 2019-10-11 DIAGNOSIS — K219 Gastro-esophageal reflux disease without esophagitis: Secondary | ICD-10-CM | POA: Insufficient documentation

## 2019-10-14 ENCOUNTER — Ambulatory Visit: Payer: Medicare Other

## 2019-10-17 ENCOUNTER — Telehealth: Payer: Self-pay | Admitting: Nurse Practitioner

## 2019-10-17 MED FILL — ?LANSOPRAZOLE DR 30MG CAPSU: 30 | 30 days supply | Qty: 30 | Fill #1

## 2019-10-17 NOTE — Telephone Encounter (Signed)
Patient called in and requested for a referral to be sent to ENT [phone] with Dr.Downs.

## 2019-10-21 NOTE — Telephone Encounter (Signed)
Attempt to reach patient regarding why she need an ENT referral. No answer and LVM for call back.

## 2019-10-31 ENCOUNTER — Ambulatory Visit: Payer: Medicare Other | Admitting: Obstetrics & Gynecology

## 2019-10-31 ENCOUNTER — Encounter: Payer: Self-pay | Admitting: Family Medicine

## 2019-11-02 ENCOUNTER — Ambulatory Visit: Payer: Medicare Other | Attending: Nurse Practitioner | Admitting: Nurse Practitioner

## 2019-11-02 ENCOUNTER — Other Ambulatory Visit: Payer: Self-pay | Admitting: Nurse Practitioner

## 2019-11-02 ENCOUNTER — Other Ambulatory Visit: Payer: Self-pay

## 2019-11-02 ENCOUNTER — Encounter: Payer: Self-pay | Admitting: Nurse Practitioner

## 2019-11-02 DIAGNOSIS — Z1331 Encounter for screening for depression: Secondary | ICD-10-CM | POA: Diagnosis not present

## 2019-11-02 DIAGNOSIS — Z79899 Other long term (current) drug therapy: Secondary | ICD-10-CM | POA: Insufficient documentation

## 2019-11-02 DIAGNOSIS — F32A Depression, unspecified: Secondary | ICD-10-CM

## 2019-11-02 DIAGNOSIS — K219 Gastro-esophageal reflux disease without esophagitis: Secondary | ICD-10-CM | POA: Diagnosis not present

## 2019-11-02 DIAGNOSIS — I10 Essential (primary) hypertension: Secondary | ICD-10-CM | POA: Diagnosis not present

## 2019-11-02 DIAGNOSIS — F320 Major depressive disorder, single episode, mild: Secondary | ICD-10-CM | POA: Diagnosis not present

## 2019-11-02 DIAGNOSIS — F329 Major depressive disorder, single episode, unspecified: Secondary | ICD-10-CM | POA: Insufficient documentation

## 2019-11-02 DIAGNOSIS — F419 Anxiety disorder, unspecified: Secondary | ICD-10-CM | POA: Insufficient documentation

## 2019-11-02 DIAGNOSIS — R0981 Nasal congestion: Secondary | ICD-10-CM

## 2019-11-02 DIAGNOSIS — Z8719 Personal history of other diseases of the digestive system: Secondary | ICD-10-CM | POA: Insufficient documentation

## 2019-11-02 MED ORDER — FLUTICASONE PROPIONATE 50 MCG/ACT NA SUSP: 2.0000 | Freq: Every day | NASAL | 6 refills | Status: DC

## 2019-11-02 MED ORDER — BUPROPION HCL ER (XL) 150 MG PO TB24: 150.0000 mg | ORAL_TABLET | Freq: Every day | ORAL | 0 refills | Status: DC

## 2019-11-02 MED ORDER — BLOOD PRESSURE MONITOR DEVI: 0 refills | Status: DC

## 2019-11-02 MED ORDER — LANSOPRAZOLE 30 MG PO CPDR: 30.0000 mg | DELAYED_RELEASE_CAPSULE | Freq: Two times a day (BID) | ORAL | 1 refills | Status: DC

## 2019-11-02 MED ORDER — AMLODIPINE BESYLATE 5 MG PO TABS: 10.0000 mg | ORAL_TABLET | Freq: Every day | ORAL | 1 refills | Status: DC

## 2019-11-02 MED FILL — FLUTICASONE PROP 50 MCG SPR: 50 | 30 days supply | Qty: 16 | Fill #0

## 2019-11-02 MED FILL — LANSOPRAZOLE DR 30 MG CAPSU: 30 | 90 days supply | Qty: 180 | Fill #0

## 2019-11-02 MED FILL — BUPROPION HCL XL 150 MG TAB: 150 | 90 days supply | Qty: 90 | Fill #0

## 2019-11-02 MED FILL — AMLODIPINE BESYLATE 5 MG TA: 5 | 90 days supply | Qty: 180 | Fill #0

## 2019-11-02 NOTE — Progress Notes (Signed)
Virtual Visit via Telephone Note Due to national recommendations of social distancing due to [place] 19, telehealth visit is felt to be most appropriate for this patient at this time.  I discussed the limitations, risks, security and privacy concerns of performing an evaluation and management service by telephone and the availability of in person appointments. I also discussed with the patient that there may be a patient responsible charge related to this service. The patient expressed understanding and agreed to proceed.    I connected with Sheila Yoder on 11/02/19  at   9:10 AM EDT  EDT by telephone and verified that I am speaking with the correct person using two identifiers.   Consent I discussed the limitations, risks, security and privacy concerns of performing an evaluation and management service by telephone and the availability of in person appointments. I also discussed with the patient that there may be a patient responsible charge related to this service. The patient expressed understanding and agreed to proceed.   Location of Patient: Private Residence    Location of Provider: [place] participating in Telemedicine visit: Geryl Rankins FNP-BC [place]    History of Present Illness: Telemedicine visit for: Referral  ENT She is requesting a referral to ENT due to nasal congestion. She has tried OTC nasal sprays: Afrin which I have instructed her to stop taking. She also endorses sinus pressure and blood on tissue at times when she blows her nose. She already has an appt with Dr. Cherlyn Cushing ENT but needs a referral due to insurance requirements.    Requesting Wellbutrin specifically. States depression comes and goes. Current PHQ9 is 0.  Depression screen [facility] 2/9 11/02/2019 07/25/2019 03/22/2019 01/05/2019 07/28/2018  Decreased Interest 0 1 2 0 2  Down, Depressed, Hopeless 0 1 2 3 2   PHQ - 2 Score 0 2 4 3 4   Altered sleeping 0 1  1 3 1   Tired, decreased energy 0 1 3 3 2   Change in appetite 0 1 0 2 0  Feeling bad or failure about yourself  0 0 2 1 2   Trouble concentrating 0 1 2 0 2  Moving slowly or fidgety/restless 0 0 2 0 2  Suicidal thoughts 0 0 0 0 2  PHQ-9 Score 0 6 14 12 15     Essential Hypertension Well controlled. Requesting to take the 5 mg tablet of Amlodipine as the 10mg  tablet is too large. Denies chest pain, shortness of breath, palpitations, lightheadedness, dizziness, headaches or BLE edema.  BP Readings from Last 3 Encounters:  09/01/19 133/83  08/15/19 (!) 155/83  06/16/19 (!) 152/80      Past Medical History:  Diagnosis Date  . Anemia   . Anxiety    self reported  . Depression    self reported  . GERD (gastroesophageal reflux disease)   . Hemorrhoids   . History of Helicobacter pylori infection    per patient report  . Hypertension   . Insomnia   . Obesity   . Peri-menopause 08/23/2014  . Screening for STD (sexually transmitted disease) 08/23/2014    Past Surgical History:  Procedure Laterality Date  . BALLOON DILATION N/A 08/15/2019   Procedure: BALLOON DILATION;  Surgeon: Irene Shipper, MD;  Location: [facility];  Service: Endoscopy;  Laterality: N/A;  . BIOPSY  08/15/2019   Procedure: BIOPSY;  Surgeon: Irene Shipper, MD;  Location: [facility];  Service: Endoscopy;;  EGD and COLON  .  BREAST REDUCTION SURGERY    . CESAREAN SECTION    . CHOLECYSTECTOMY    . COLONOSCOPY WITH PROPOFOL N/A 08/15/2019   Procedure: COLONOSCOPY WITH PROPOFOL;  Surgeon: Irene Shipper, MD;  Location: [facility];  Service: Endoscopy;  Laterality: N/A;  . ESOPHAGEAL DILATION  2013   in [place]   . ESOPHAGOGASTRODUODENOSCOPY (EGD) WITH PROPOFOL N/A 08/15/2019   Procedure: ESOPHAGOGASTRODUODENOSCOPY (EGD) WITH PROPOFOL;  Surgeon: Irene Shipper, MD;  Location: [facility];  Service: Endoscopy;  Laterality: N/A;  . POLYPECTOMY  08/15/2019   Procedure: POLYPECTOMY;  Surgeon: Irene Shipper, MD;   Location: [facility];  Service: Endoscopy;;  . TUBAL LIGATION      Family History  Problem Relation Age of Onset  . Diabetes Other        maternal nephew  . Hypertension Mother   . Diabetes Mother   . Arthritis Mother        rheumatoid  . Hyperlipidemia Mother   . Hypertension Sister   . Diabetes Sister   . Other Brother        hunting accident  . Other Daughter        crohn's disease  . Hypertension Sister   . Diabetes Sister   . Hypertension Sister   . Hypertension Sister   . Hypertension Sister   . Lupus Sister   . Other Brother        fell off building  . Diabetes Brother   . Hypertension Brother   . Hypertension Brother   . Stomach cancer Neg Hx   . Esophageal cancer Neg Hx   . Pancreatic cancer Neg Hx   . Colon cancer Neg Hx   . Rectal cancer Neg Hx   . Colon polyps Neg Hx     Social History   Socioeconomic History  . Marital status: Single    Spouse name: Not on file  . Number of children: Not on file  . Years of education: Not on file  . Highest education level: Not on file  Occupational History  . Not on file  Tobacco Use  . Smoking status: Never Smoker  . Smokeless tobacco: Never Used  Substance and Sexual Activity  . Alcohol use: No  . Drug use: No  . Sexual activity: Not Currently    Birth control/protection: Surgical    Comment: tubal  Other Topics Concern  . Not on file  Social History Narrative  . Not on file   Social Determinants of Health   Financial Resource Strain:   . Difficulty of Paying Living Expenses:   Food Insecurity:   . Worried About Charity fundraiser in the Last Year:   . Arboriculturist in the Last Year:   Transportation Needs:   . Film/video editor (Medical):   Marland Kitchen Lack of Transportation (Non-Medical):   Physical Activity:   . Days of Exercise per Week:   . Minutes of Exercise per Session:   Stress:   . Feeling of Stress :   Social Connections:   . Frequency of Communication with Friends and Family:   .  Frequency of Social Gatherings with Friends and Family:   . Attends Religious Services:   . Active Member of Clubs or Organizations:   . Attends Archivist Meetings:   Marland Kitchen Marital Status:      Observations/Objective: Awake, alert and oriented x 3   Review of Systems  Constitutional: Negative for fever, malaise/fatigue and weight loss.  HENT:  Positive for congestion. Negative for nosebleeds.   Eyes: Negative.  Negative for blurred vision, double vision and photophobia.  Respiratory: Negative.  Negative for cough and shortness of breath.   Cardiovascular: Negative.  Negative for chest pain, palpitations and leg swelling.  Gastrointestinal: Positive for heartburn. Negative for nausea and vomiting.  Musculoskeletal: Negative.  Negative for myalgias.  Neurological: Negative.  Negative for dizziness, focal weakness, seizures and headaches.  Psychiatric/Behavioral: Positive for depression. Negative for suicidal ideas.    Assessment and Plan: [place] was seen today for referral.  Diagnoses and all orders for this visit:  Chronic nasal congestion -     fluticasone (FLONASE) 50 MCG/ACT nasal spray; Place 2 sprays into both nostrils daily. -     Ambulatory referral to ENT  Mild episode of depression ([place]) -     buPROPion (WELLBUTRIN XL) 150 MG 24 hr tablet; Take 1 tablet (150 mg total) by mouth daily.  Essential hypertension -     Blood Pressure Monitor DEVI; Please provide patient with insurance (MEDICAID) approved blood pressure monitor ICD I10 -     amLODipine (NORVASC) 5 MG tablet; Take 2 tablets (10 mg total) by mouth daily.  Gastroesophageal reflux disease without esophagitis Symptoms well controlled with prevacid. She has tried other PPIs with no relief of symptoms.  -     lansoprazole (PREVACID) 30 MG capsule; Take 1 capsule (30 mg total) by mouth 2 (two) times daily before a meal. INSTRUCTIONS: Avoid GERD Triggers: acidic, spicy or fried foods, caffeine, coffee, sodas,   alcohol and chocolate.    Follow Up Instructions Return in about 2 months (around 01/02/2020).     I discussed the assessment and treatment plan with the patient. The patient was provided an opportunity to ask questions and all were answered. The patient agreed with the plan and demonstrated an understanding of the instructions.   The patient was advised to call back or seek an in-person evaluation if the symptoms worsen or if the condition fails to improve as anticipated.  I provided 19 minutes of non-face-to-face time during this encounter including median intraservice time, reviewing previous notes, labs, imaging, medications and explaining diagnosis and management.  Gildardo Pounds, FNP-BC

## 2019-11-04 ENCOUNTER — Other Ambulatory Visit: Payer: Self-pay | Admitting: Nurse Practitioner

## 2019-11-04 DIAGNOSIS — R928 Other abnormal and inconclusive findings on diagnostic imaging of breast: Secondary | ICD-10-CM

## 2019-11-07 ENCOUNTER — Other Ambulatory Visit: Payer: Self-pay | Admitting: Nurse Practitioner

## 2019-11-07 ENCOUNTER — Telehealth: Payer: Self-pay | Admitting: Nurse Practitioner

## 2019-11-07 DIAGNOSIS — R928 Other abnormal and inconclusive findings on diagnostic imaging of breast: Secondary | ICD-10-CM

## 2019-11-16 ENCOUNTER — Other Ambulatory Visit: Payer: Medicare Other

## 2019-12-01 ENCOUNTER — Ambulatory Visit
Admission: RE | Admit: 2019-12-01 | Discharge: 2019-12-01 | Disposition: A | Discharge status: Home / Self Care | Payer: Medicare Other | Source: Ambulatory Visit | Attending: Nurse Practitioner | Admitting: Nurse Practitioner

## 2019-12-01 ENCOUNTER — Other Ambulatory Visit: Payer: Self-pay | Admitting: Nurse Practitioner

## 2019-12-01 ENCOUNTER — Other Ambulatory Visit: Payer: Self-pay

## 2019-12-01 DIAGNOSIS — R928 Other abnormal and inconclusive findings on diagnostic imaging of breast: Secondary | ICD-10-CM

## 2019-12-01 DIAGNOSIS — N6320 Unspecified lump in the left breast, unspecified quadrant: Secondary | ICD-10-CM

## 2019-12-05 ENCOUNTER — Other Ambulatory Visit: Payer: Self-pay

## 2019-12-05 ENCOUNTER — Ambulatory Visit
Admission: RE | Admit: 2019-12-05 | Discharge: 2019-12-05 | Disposition: A | Discharge status: Home / Self Care | Payer: Medicare Other | Source: Ambulatory Visit | Attending: Nurse Practitioner | Admitting: Nurse Practitioner

## 2019-12-05 DIAGNOSIS — R928 Other abnormal and inconclusive findings on diagnostic imaging of breast: Secondary | ICD-10-CM

## 2019-12-06 ENCOUNTER — Telehealth: Payer: Self-pay | Admitting: Nurse Practitioner

## 2019-12-06 NOTE — Telephone Encounter (Signed)
Pt called asking to be put on Claritin-D 24 prescribed so insurance will pay please send to [facility] on [address]

## 2019-12-06 NOTE — Telephone Encounter (Signed)
Claritin D is not recommended with HTN. I would defer her to ENT for nasal congestion and prescription. She can call their office regarding this if flonase spray is ineffective.

## 2019-12-15 NOTE — Telephone Encounter (Signed)
Spoke to patient and informed on PCP advising. Pt. Understood.  Pt. Stated she have an appt. With ENT and will discuss it w/ them.

## 2019-12-19 ENCOUNTER — Ambulatory Visit: Payer: Self-pay | Admitting: Surgery

## 2019-12-19 DIAGNOSIS — N6320 Unspecified lump in the left breast, unspecified quadrant: Secondary | ICD-10-CM

## 2019-12-19 NOTE — H&P (Signed)
History of Present Illness Sheila Yoder. Zack Crager MD; 12/19/2019 1:26 PM) The patient is a 54 year old female who presents with a breast mass. Referred by Geryl Rankins nurse practitioner for left breast Mass.  This is a 54 year old female with hypertension who presents after recent screening mammogram. Her previous mammogram was more than 10 years ago. This screening mammogram revealed a finding in the lower outer quadrant of the left breast. She underwent further workup which revealed a 5 x 4 x 3 mm mass at 5:30 in the left breast 3 cm from the nipple. She underwent core biopsy of this area which revealed diagnosis of complex sclerosing lesion and PASH. She presents now to discuss excision. She denies any family history of breast cancer. No previous breast surgery.  CLINICAL DATA: Patient recalled from screening for left breast mass.  EXAM: DIGITAL DIAGNOSTIC LEFT MAMMOGRAM WITH CAD AND TOMO  ULTRASOUND LEFT BREAST  COMPARISON: None.  ACR Breast Density Category b: There are scattered areas of fibroglandular density.  FINDINGS: There is a persistent lobular mass within the 6 o'clock position left breast anterior depth, further evaluated with spot compression CC and MLO tomosynthesis images.  Mammographic images were processed with CAD.  Targeted ultrasound is performed, showing a 5 x 4 x 3 mm solid and cystic mass left breast 5:30 o'clock 3 cm from nipple.  No left axillary adenopathy.  IMPRESSION: Indeterminate left breast mass.  RECOMMENDATION: Ultrasound-guided core needle biopsy indeterminate left breast mass.  I have discussed the findings and recommendations with the patient. If applicable, a reminder letter will be sent to the patient regarding the next appointment.  BI-RADS CATEGORY 4: Suspicious.   Electronically Signed By: Lovey Newcomer M.D. On: 12/01/2019 15:43   Problem List/Past Medical Rodman Key K. 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 Cesarean Section - Multiple Colon Polyp Removal - Colonoscopy Mammoplasty; Reduction Bilateral.  Diagnostic Studies History (Chanel Teressa Senter, CMA; 12/19/2019 8:35 AM) Colonoscopy within last year Mammogram within last year Pap Smear 1-5 years ago  Allergies (Chanel Teressa Senter, CMA; 12/19/2019 8:36 AM) Bactrim *ANTI-INFECTIVE AGENTS - MISC.* Allergies Reconciled  Medication History Sheila Yoder. Sahithi Ordoyne, MD; 12/19/2019 1:26 PM) amLODIPine Besylate (10MG  Tablet, Oral) Active. Ferrous Sulfate (Oral) Specific strength unknown - Active. Garlic (Oral) Specific strength unknown - Active. LORazepam (Oral) Specific strength unknown - Active. Medications Reconciled Lansoprazole (30MG  Capsule DR, Oral) Active.  Social History (Chanel Teressa Senter, [place]; 12/19/2019 8:35 AM) No alcohol use No caffeine use No drug use Tobacco use Never smoker.  Family History Antonietta Jewel, [place]; 12/19/2019 8:35 AM) Arthritis Mother. Diabetes Mellitus Brother, Mother, Sister. Hypertension Brother, Mother, Sister.  Pregnancy / Birth History Antonietta Jewel, [place]; 12/19/2019 8:35 AM) Age at menarche 89 years. Age of menopause 45-55 Gravida 2 Maternal age 5-25 Para 2  Other Problems Sheila Yoder. Chioke Noxon, MD; 12/19/2019 1:26 PM) Gastroesophageal Reflux Disease     Review of Systems (Chanel Nolan CMA; 12/19/2019 8:35 AM) General Not Present- Appetite Loss, Chills, Fatigue, Fever, Night Sweats, Weight Gain and Weight Loss. Skin Not Present- Change in Wart/Mole, Dryness, Hives, Jaundice, New Lesions, Non-Healing Wounds, Rash and Ulcer. HEENT Present- Seasonal Allergies. Not Present- Earache, Hearing Loss, Hoarseness, Nose Bleed, Oral Ulcers, Ringing in the Ears, Sinus Pain, Sore Throat, Visual Disturbances, Wears glasses/contact lenses and Yellow Eyes. Breast Present- Breast Mass. Not Present-  Breast Pain, Nipple Discharge and Skin Changes. Cardiovascular Not Present- Chest Pain, Difficulty  Breathing Lying Down, Leg Cramps, Palpitations, Rapid Heart Rate, Shortness of Breath and Swelling of Extremities. Gastrointestinal Not Present- Abdominal Pain, Bloating, Bloody Stool, Change in Bowel Habits, Chronic diarrhea, Constipation, Difficulty Swallowing, Excessive gas, Gets full quickly at meals, Hemorrhoids, Indigestion, Nausea, Rectal Pain and Vomiting. Female Genitourinary Present- Urgency. Not Present- Frequency, Nocturia, Painful Urination and Pelvic Pain. Musculoskeletal Not Present- Back Pain, Joint Pain, Joint Stiffness, Muscle Pain, Muscle Weakness and Swelling of Extremities. Neurological Not Present- Decreased Memory, Fainting, Headaches, Numbness, Seizures, Tingling, Tremor, Trouble walking and Weakness. Psychiatric Not Present- Anxiety, Bipolar, Change in Sleep Pattern, Depression, Fearful and Frequent crying. Endocrine Not Present- Cold Intolerance, Excessive Hunger, Hair Changes, Heat Intolerance, Hot flashes and New Diabetes. Hematology Not Present- Blood Thinners, Easy Bruising, Excessive bleeding, Gland problems, HIV and Persistent Infections.  Vitals (Chanel Nolan CMA; 12/19/2019 8:37 AM) 12/19/2019 8:37 AM Weight: 339.25 lb Height: 66in Body Surface Area: 2.5 m Body Mass Index: 54.76 kg/m  Temp.: 97.38F  Pulse: 100 (Regular)  BP: 132/78(Sitting, Left Arm, Standard)        Physical Exam Rodman Key K. Ricci Paff MD; 12/19/2019 1:27 PM)  The physical exam findings are as follows: Note:Constitutional: WDWN in NAD, conversant, no obvious deformities; resting comfortably Eyes: Pupils equal, round; sclera anicteric; moist conjunctiva; no lid lag HENT: Oral mucosa moist; good dentition Neck: No masses palpated, trachea midline; no thyromegaly Lungs: CTA bilaterally; normal respiratory effort Breasts: symmetric; no nipple changes; fibrocystic changes; no dominant  masses; no axillary or supraclavicular lymphadenopathy CV: Regular rate and rhythm; no murmurs; extremities well-perfused with no edema Abd: +bowel sounds, obese, soft, non-tender, no palpable organomegaly; no palpable hernias Musc: Normal gait; no apparent clubbing or cyanosis in extremities Lymphatic: No palpable cervical or axillary lymphadenopathy Skin: Warm, dry; no sign of jaundice Psychiatric - alert and oriented x 4; calm mood and affect    Assessment & Plan Rodman Key K. Azavier Creson MD; 12/19/2019 11:41 AM)  COMPLEX SCLEROSING LESION OF LEFT BREAST (N64.89) Impression: 5:30 3 cmfn 5 x 4 x 3 mm  Current Plans Schedule for Surgery - Left radioactive seed localized lumpectomy. The surgical procedure has been discussed with the patient. Potential risks, benefits, alternative treatments, and expected outcomes have been explained. All of the patient's questions at this time have been answered. The likelihood of reaching the patient's treatment goal is good. The patient understand the proposed surgical procedure and wishes to proceed.  Sheila Yoder. Georgette Dover, MD, [facility]  General/ Trauma Surgery   12/19/2019 1:27 PM

## 2019-12-21 ENCOUNTER — Ambulatory Visit: Payer: Medicare Other | Attending: Nurse Practitioner

## 2019-12-21 ENCOUNTER — Other Ambulatory Visit: Payer: Self-pay

## 2019-12-21 DIAGNOSIS — Z131 Encounter for screening for diabetes mellitus: Secondary | ICD-10-CM

## 2019-12-21 DIAGNOSIS — Z13220 Encounter for screening for lipoid disorders: Secondary | ICD-10-CM

## 2019-12-21 DIAGNOSIS — D649 Anemia, unspecified: Secondary | ICD-10-CM

## 2019-12-22 ENCOUNTER — Other Ambulatory Visit: Payer: Self-pay | Admitting: Surgery

## 2019-12-22 DIAGNOSIS — N6320 Unspecified lump in the left breast, unspecified quadrant: Secondary | ICD-10-CM

## 2019-12-22 LAB — IRON,TIBC AND FERRITIN PANEL
Ferritin: 253 ng/mL — ABNORMAL HIGH (ref 15–150)
Iron Saturation: 23 % (ref 15–55)
Iron: 70 ug/dL (ref 27–159)
Total Iron Binding Capacity: 299 ug/dL (ref 250–450)
UIBC: 229 ug/dL (ref 131–425)

## 2019-12-22 LAB — CBC
Hematocrit: 37.8 % (ref 34.0–46.6)
Hemoglobin: 12.4 g/dL (ref 11.1–15.9)
MCH: 30.5 pg (ref 26.6–33.0)
MCHC: 32.8 g/dL (ref 31.5–35.7)
MCV: 93 fL (ref 79–97)
Platelets: 295 10*3/uL (ref 150–450)
RBC: 4.06 x10E6/uL (ref 3.77–5.28)
RDW: 12.9 % (ref 11.7–15.4)
WBC: 6.6 10*3/uL (ref 3.4–10.8)

## 2019-12-22 LAB — LIPID PANEL
Chol/HDL Ratio: 5.1 ratio — ABNORMAL HIGH (ref 0.0–4.4)
Cholesterol, Total: 255 mg/dL — ABNORMAL HIGH (ref 100–199)
HDL: 50 mg/dL (ref >39)
LDL Chol Calc (NIH): 176 mg/dL — ABNORMAL HIGH (ref 0–99)
Triglycerides: 160 mg/dL — ABNORMAL HIGH (ref 0–149)
VLDL Cholesterol Cal: 29 mg/dL (ref 5–40)

## 2019-12-22 LAB — HEMOGLOBIN A1C
Est. average glucose Bld gHb Est-mCnc: 111 mg/dL
Hgb A1c MFr Bld: 5.5 % (ref 4.8–5.6)

## 2020-01-03 ENCOUNTER — Telehealth: Payer: Self-pay

## 2020-01-03 NOTE — Telephone Encounter (Signed)
Will call patient when labs are review by PCP.

## 2020-01-03 NOTE — Telephone Encounter (Signed)
Copied from [place] [phone]. Topic: General - Other >> Dec 29, 2019  2:11 PM Alanda Slim E wrote: Reason for CRM: Pt would like a call to discuss her lab results / if no answer, please leave a message / please advise

## 2020-01-04 NOTE — Telephone Encounter (Signed)
Pt called back in to follow up on lab results. Advised per message below.

## 2020-01-05 ENCOUNTER — Other Ambulatory Visit: Payer: Self-pay | Admitting: Nurse Practitioner

## 2020-01-05 MED ORDER — ATORVASTATIN CALCIUM 40 MG PO TABS
40.0000 mg | ORAL_TABLET | Freq: Every day | ORAL | 3 refills | Status: DC
Start: 2020-01-05 — End: 2020-01-09

## 2020-01-05 MED FILL — ATORVASTATIN CALCIUM 40 MG: 40 | 90 days supply | Qty: 90 | Fill #0

## 2020-01-09 ENCOUNTER — Encounter: Payer: Self-pay | Admitting: Nurse Practitioner

## 2020-01-09 ENCOUNTER — Ambulatory Visit: Payer: Medicare Other | Attending: Nurse Practitioner | Admitting: Nurse Practitioner

## 2020-01-09 ENCOUNTER — Telehealth: Payer: Self-pay

## 2020-01-09 DIAGNOSIS — E783 Hyperchylomicronemia: Secondary | ICD-10-CM | POA: Diagnosis not present

## 2020-01-09 DIAGNOSIS — I10 Essential (primary) hypertension: Secondary | ICD-10-CM | POA: Diagnosis present

## 2020-01-09 DIAGNOSIS — Z8249 Family history of ischemic heart disease and other diseases of the circulatory system: Secondary | ICD-10-CM | POA: Diagnosis not present

## 2020-01-09 DIAGNOSIS — K219 Gastro-esophageal reflux disease without esophagitis: Secondary | ICD-10-CM | POA: Diagnosis not present

## 2020-01-09 DIAGNOSIS — E669 Obesity, unspecified: Secondary | ICD-10-CM | POA: Insufficient documentation

## 2020-01-09 MED ORDER — PRAVASTATIN SODIUM 40 MG PO TABS
40.0000 mg | ORAL_TABLET | Freq: Every day | ORAL | 3 refills | Status: DC
Start: 2020-01-09 — End: 2020-04-26

## 2020-01-09 MED FILL — PRAVASTATIN NA 40 MG TAB: 40 | 90 days supply | Qty: 90 | Fill #0

## 2020-01-09 NOTE — Telephone Encounter (Signed)
Called pt LVM explaining pt does not need transportation form filled out. Pt has Medicaid & is eligible for transportation services. Pt needs to call transportation [phone] to schedule the date/time of appt.

## 2020-01-09 NOTE — Progress Notes (Signed)
Virtual Visit via Telephone Note Due to national recommendations of social distancing due to [place] 19, telehealth visit is felt to be most appropriate for this patient at this time.  I discussed the limitations, risks, security and privacy concerns of performing an evaluation and management service by telephone and the availability of in person appointments. I also discussed with the patient that there may be a patient responsible charge related to this service. The patient expressed understanding and agreed to proceed.    I connected with Starleen Blue on 01/09/20  at   9:30 AM EDT  EDT by telephone and verified that I am speaking with the correct person using two identifiers.   Consent I discussed the limitations, risks, security and privacy concerns of performing an evaluation and management service by telephone and the availability of in person appointments. I also discussed with the patient that there may be a patient responsible charge related to this service. The patient expressed understanding and agreed to proceed.   Location of Patient: Private Residence    Location of Provider: [place] and [place] participating in Telemedicine visit: Geryl Rankins FNP-BC [place]    History of Present Illness: Telemedicine visit for: Requesting to switch her cholesterol medication Atorvastatin. States she was told atorvastatin "has bad side effects". She is currently taking red yeast rice, omega and  Garlic. Would like to also take niacin. I did instruct her regarding the side effects of niacin as well as statis in general. She is requesting to start pravastatin. We did discuss her ASCVD score and the need for moderate to high intensity statin to help reduce the risk of heart attack or stroke The 10-year ASCVD risk score Mikey Bussing DC Brooke Bonito., et al., 2013) is: 15.9%   Values used to calculate the score:     Age: 12 years     Sex: Female     Is  Non-Hispanic African American: Yes     Diabetic: No     Tobacco smoker: No     Systolic Blood Pressure: 423 mmHg     Is BP treated: Yes     HDL Cholesterol: 50 mg/dL     Total Cholesterol: 255 mg/dL    Past Medical History:  Diagnosis Date  . Anemia   . Anxiety    self reported  . Depression    self reported  . GERD (gastroesophageal reflux disease)   . Hemorrhoids   . History of Helicobacter pylori infection    per patient report  . Hypertension   . Insomnia   . Obesity   . Peri-menopause 08/23/2014  . Screening for STD (sexually transmitted disease) 08/23/2014    Past Surgical History:  Procedure Laterality Date  . BALLOON DILATION N/A 08/15/2019   Procedure: BALLOON DILATION;  Surgeon: Irene Shipper, MD;  Location: [facility];  Service: Endoscopy;  Laterality: N/A;  . BIOPSY  08/15/2019   Procedure: BIOPSY;  Surgeon: Irene Shipper, MD;  Location: [facility];  Service: Endoscopy;;  EGD and COLON  . BREAST REDUCTION SURGERY    . CESAREAN SECTION    . CHOLECYSTECTOMY    . COLONOSCOPY WITH PROPOFOL N/A 08/15/2019   Procedure: COLONOSCOPY WITH PROPOFOL;  Surgeon: Irene Shipper, MD;  Location: [facility];  Service: Endoscopy;  Laterality: N/A;  . ESOPHAGEAL DILATION  2013   in [place]   . ESOPHAGOGASTRODUODENOSCOPY (EGD) WITH PROPOFOL N/A 08/15/2019   Procedure: ESOPHAGOGASTRODUODENOSCOPY (EGD) WITH PROPOFOL;  Surgeon:  Irene Shipper, MD;  Location: [facility];  Service: Endoscopy;  Laterality: N/A;  . POLYPECTOMY  08/15/2019   Procedure: POLYPECTOMY;  Surgeon: Irene Shipper, MD;  Location: [facility];  Service: Endoscopy;;  . REDUCTION MAMMAPLASTY Bilateral 2000  . TUBAL LIGATION      Family History  Problem Relation Age of Onset  . Diabetes Other        maternal nephew  . Hypertension Mother   . Diabetes Mother   . Arthritis Mother        rheumatoid  . Hyperlipidemia Mother   . Hypertension Sister   . Diabetes Sister   . Other Brother        hunting  accident  . Other Daughter        crohn's disease  . Hypertension Sister   . Diabetes Sister   . Hypertension Sister   . Hypertension Sister   . Hypertension Sister   . Lupus Sister   . Other Brother        fell off building  . Diabetes Brother   . Hypertension Brother   . Hypertension Brother   . Stomach cancer Neg Hx   . Esophageal cancer Neg Hx   . Pancreatic cancer Neg Hx   . Colon cancer Neg Hx   . Rectal cancer Neg Hx   . Colon polyps Neg Hx     Social History   Socioeconomic History  . Marital status: Single    Spouse name: Not on file  . Number of children: Not on file  . Years of education: Not on file  . Highest education level: Not on file  Occupational History  . Not on file  Tobacco Use  . Smoking status: Never Smoker  . Smokeless tobacco: Never Used  Vaping Use  . Vaping Use: Never used  Substance and Sexual Activity  . Alcohol use: No  . Drug use: No  . Sexual activity: Not Currently    Birth control/protection: Surgical    Comment: tubal  Other Topics Concern  . Not on file  Social History Narrative  . Not on file   Social Determinants of Health   Financial Resource Strain:   . Difficulty of Paying Living Expenses:   Food Insecurity:   . Worried About Charity fundraiser in the Last Year:   . Arboriculturist in the Last Year:   Transportation Needs:   . Film/video editor (Medical):   Marland Kitchen Lack of Transportation (Non-Medical):   Physical Activity:   . Days of Exercise per Week:   . Minutes of Exercise per Session:   Stress:   . Feeling of Stress :   Social Connections:   . Frequency of Communication with Friends and Family:   . Frequency of Social Gatherings with Friends and Family:   . Attends Religious Services:   . Active Member of Clubs or Organizations:   . Attends Archivist Meetings:   Marland Kitchen Marital Status:      Observations/Objective: Awake, alert and oriented x 3   Review of Systems  Constitutional: Negative  for fever, malaise/fatigue and weight loss.  HENT: Negative.  Negative for nosebleeds.   Eyes: Negative.  Negative for blurred vision, double vision and photophobia.  Respiratory: Negative.  Negative for cough and shortness of breath.   Cardiovascular: Negative.  Negative for chest pain, palpitations and leg swelling.  Gastrointestinal: Negative.  Negative for heartburn, nausea and vomiting.  Musculoskeletal: Negative.  Negative for  myalgias.  Neurological: Negative.  Negative for dizziness, focal weakness, seizures and headaches.  Psychiatric/Behavioral: Negative.  Negative for suicidal ideas.    Assessment and Plan: There are no diagnoses linked to this encounter.   Follow Up Instructions No follow-ups on file.     I discussed the assessment and treatment plan with the patient. The patient was provided an opportunity to ask questions and all were answered. The patient agreed with the plan and demonstrated an understanding of the instructions.   The patient was advised to call back or seek an in-person evaluation if the symptoms worsen or if the condition fails to improve as anticipated.  I provided 14 minutes of non-face-to-face time during this encounter including median intraservice time, reviewing previous notes, labs, imaging, medications and explaining diagnosis and management.  Gildardo Pounds, FNP-BC

## 2020-01-11 ENCOUNTER — Encounter: Payer: Self-pay | Admitting: Nurse Practitioner

## 2020-01-12 DIAGNOSIS — Z860100 Personal history of colon polyps, unspecified: Secondary | ICD-10-CM | POA: Insufficient documentation

## 2020-01-12 DIAGNOSIS — J342 Deviated nasal septum: Secondary | ICD-10-CM | POA: Insufficient documentation

## 2020-01-16 ENCOUNTER — Other Ambulatory Visit: Payer: Medicare Other

## 2020-01-19 ENCOUNTER — Telehealth: Payer: Self-pay | Admitting: Nurse Practitioner

## 2020-01-19 NOTE — Telephone Encounter (Signed)
Attempted several times to contact pharmacy about pts refill for fluticasone (FLONASE) 50 MCG/ACT nasal spray Pt would like this mailed to her / please advise

## 2020-01-20 ENCOUNTER — Other Ambulatory Visit (HOSPITAL_COMMUNITY): Payer: Medicare Other

## 2020-01-21 ENCOUNTER — Other Ambulatory Visit (HOSPITAL_COMMUNITY): Payer: Medicare Other

## 2020-02-02 DIAGNOSIS — J3489 Other specified disorders of nose and nasal sinuses: Secondary | ICD-10-CM | POA: Insufficient documentation

## 2020-02-02 MED FILL — FLUTICASONE PROP 50 MCG SPR: 50 | 30 days supply | Qty: 16 | Fill #1

## 2020-02-02 MED FILL — AZELASTINE HCL 137 MCG SPRY: 0.1 | 30 days supply | Qty: 30 | Fill #0

## 2020-02-03 ENCOUNTER — Other Ambulatory Visit: Payer: Self-pay | Admitting: Family Medicine

## 2020-02-03 ENCOUNTER — Other Ambulatory Visit: Payer: Self-pay | Admitting: Pharmacist

## 2020-02-03 MED ORDER — OMEPRAZOLE 40 MG PO CPDR
40.0000 mg | DELAYED_RELEASE_CAPSULE | Freq: Every day | ORAL | 3 refills | Status: DC
Start: 2020-02-03 — End: 2020-04-26

## 2020-02-03 MED FILL — OMEPRAZOLE DR 40 MG CAPSULE: 40 | 30 days supply | Qty: 30 | Fill #0

## 2020-02-03 NOTE — Progress Notes (Signed)
Change from lansoprazole to omeprazole per insurance.

## 2020-03-01 NOTE — Pre-Procedure Instructions (Signed)
[place] [place] [address] [place] [address] Phone: [phone] Fax: [phone]      Your procedure is scheduled on Tuesday 03/13/20.  Report to [facility] Main Entrance "A" at 5:30 A.M., and check in at the Admitting office.  Call this number if you have problems the morning of surgery:  [phone]  Call [phone] if you have any questions prior to your surgery date Monday-Friday 8am-4pm    Remember:  Do not eat after midnight the night before your surgery  You may drink clear liquids until 4:30am the morning of your surgery.   Clear liquids allowed are: Water, Non-Citrus Juices (without pulp), Carbonated Beverages, Clear Tea, Black Coffee Only, and Gatorade    Take these medicines the morning of surgery with A SIP OF WATER   amLODipine (NORVASC) 5 MG tablet  buPROPion (WELLBUTRIN XL) 150 MG 24 hr tablet  diphenhydrAMINE (BENADRYL) 25 MG tablet  Fexofenadine HCl (MUCINEX ALLERGY PO)  fluticasone (FLONASE) 50 MCG/ACT nasal spray    omeprazole (PRILOSEC) 40 MG capsule  oxymetazoline (AFRIN) 0.05 % nasal spray if needed   pravastatin (PRAVACHOL) 40 MG tablet  acetaminophen (TYLENOL) 500 MG tablet if needed  As of today, STOP taking any Aspirin (unless otherwise instructed by your surgeon) Aleve, Naproxen, Ibuprofen, Motrin, Advil, Goody's, BC's, all herbal medications, fish oil, and all vitamins.                      Do not wear jewelry, make up, or nail polish            Do not wear lotions, powders, perfumes, or deodorant.            Do not shave 48 hours prior to surgery.              Do not bring valuables to the hospital.            [facility] is not responsible for any belongings or valuables.  Do NOT Smoke (Tobacco/Vaping) or drink Alcohol 24 hours prior to your procedure If you use a CPAP at night, you may bring all equipment for your overnight stay.   Contacts, glasses, dentures or bridgework  may not be worn into surgery.      For patients admitted to the hospital, discharge time will be determined by your treatment team.   Patients discharged the day of surgery will not be allowed to drive home, and someone needs to stay with them for 24 hours.    Special instructions:   [place]- Preparing For Surgery  Before surgery, you can play an important role. Because skin is not sterile, your skin needs to be as free of germs as possible. You can reduce the number of germs on your skin by washing with CHG (chlorahexidine gluconate) Soap before surgery.  CHG is an antiseptic cleaner which kills germs and bonds with the skin to continue killing germs even after washing.    Oral Hygiene is also important to reduce your risk of infection.  Remember - BRUSH YOUR TEETH THE MORNING OF SURGERY WITH YOUR REGULAR TOOTHPASTE  Please do not use if you have an allergy to CHG or antibacterial soaps. If your skin becomes reddened/irritated stop using the CHG.  Do not shave (including legs and underarms) for at least 48 hours prior to first CHG shower. It is OK to shave your face.  Please follow these instructions carefully.  1. Shower the NIGHT BEFORE SURGERY and the MORNING OF SURGERY with CHG Soap.   2. If you chose to wash your hair, wash your hair first as usual with your normal shampoo.  3. After you shampoo, rinse your hair and body thoroughly to remove the shampoo.  4. Use CHG as you would any other liquid soap. You can apply CHG directly to the skin and wash gently with a scrungie or a clean washcloth.   5. Apply the CHG Soap to your body ONLY FROM THE NECK DOWN.  Do not use on open wounds or open sores. Avoid contact with your eyes, ears, mouth and genitals (private parts). Wash Face and genitals (private parts)  with your normal soap.   6. Wash thoroughly, paying special attention to the area where your surgery will be performed.  7. Thoroughly rinse your body with warm water from  the neck down.  8. DO NOT shower/wash with your normal soap after using and rinsing off the CHG Soap.  9. Pat yourself dry with a CLEAN TOWEL.  10. Wear CLEAN PAJAMAS to bed the night before surgery  11. Place CLEAN SHEETS on your bed the night of your first shower and DO NOT SLEEP WITH PETS.   Day of Surgery: Wear Clean/Comfortable clothing the morning of surgery Do not apply any deodorants/lotions.   Remember to brush your teeth WITH YOUR REGULAR TOOTHPASTE.   Please read over the following fact sheets that you were given.

## 2020-03-02 ENCOUNTER — Encounter (HOSPITAL_COMMUNITY): Payer: Self-pay | Admitting: Vascular Surgery

## 2020-03-02 ENCOUNTER — Inpatient Hospital Stay (HOSPITAL_COMMUNITY)
Admission: RE | Admit: 2020-03-02 | Discharge: 2020-03-02 | Disposition: A | Discharge status: Home / Self Care | Payer: Medicare Other | Source: Ambulatory Visit

## 2020-03-02 NOTE — Progress Notes (Signed)
Called cell phone and home phone to find out if patient was still coming to her PAT appt.  No answer on either number, messages left to call our department.

## 2020-03-09 ENCOUNTER — Other Ambulatory Visit (HOSPITAL_COMMUNITY): Payer: Medicare Other

## 2020-03-09 DIAGNOSIS — N6489 Other specified disorders of breast: Secondary | ICD-10-CM | POA: Insufficient documentation

## 2020-03-09 DIAGNOSIS — Z6841 Body Mass Index (BMI) 40.0 and over, adult: Secondary | ICD-10-CM | POA: Insufficient documentation

## 2020-03-13 ENCOUNTER — Encounter (HOSPITAL_COMMUNITY): Admission: RE | Payer: Self-pay | Source: Home / Self Care

## 2020-03-13 ENCOUNTER — Ambulatory Visit (HOSPITAL_COMMUNITY): Admission: RE | Admit: 2020-03-13 | Payer: Medicare Other | Source: Home / Self Care | Admitting: Surgery

## 2020-03-13 SURGERY — BREAST LUMPECTOMY WITH RADIOACTIVE SEED LOCALIZATION
Anesthesia: General | Site: Breast | Laterality: Left

## 2020-03-15 ENCOUNTER — Telehealth: Payer: Self-pay | Admitting: Nurse Practitioner

## 2020-03-15 NOTE — Telephone Encounter (Signed)
Copied from [place] [id number]. Topic: General - Other >> Mar 15, 2020  9:43 AM Rainey Pines A wrote: Patient would like a callback from [place] in regards to getting the latest sleep apnea machine that doesn't require a mask. Patient also wants to know when Raul Del would advise her to do about her cholesterol medication. Please advise

## 2020-03-20 NOTE — Telephone Encounter (Signed)
CMA attempt to reach patient. No answer and LVM.

## 2020-04-06 ENCOUNTER — Institutional Professional Consult (permissible substitution): Payer: Medicare Other | Admitting: Pulmonary Disease

## 2020-04-06 MED FILL — ATORVASTATIN 40 MG TABLET: 40 | 90 days supply | Qty: 90 | Fill #1

## 2020-04-06 MED FILL — OMEPRAZOLE DR 40 MG CAPSULE: 40 | 30 days supply | Qty: 30 | Fill #1

## 2020-04-09 ENCOUNTER — Other Ambulatory Visit: Payer: Self-pay | Admitting: Nurse Practitioner

## 2020-04-09 DIAGNOSIS — I10 Essential (primary) hypertension: Secondary | ICD-10-CM

## 2020-04-09 MED FILL — FLUTICASONE PROP 50 MCG SPR: 50 | 30 days supply | Qty: 16 | Fill #2

## 2020-04-10 ENCOUNTER — Ambulatory Visit: Payer: Medicare Other | Admitting: Nurse Practitioner

## 2020-04-20 ENCOUNTER — Telehealth: Payer: Self-pay | Admitting: Nurse Practitioner

## 2020-04-20 NOTE — Telephone Encounter (Signed)
Copied from [place] [phone]. Topic: Appointment Scheduling - Scheduling Inquiry for Clinic >> Apr 20, 2020 12:41 PM Lennox Solders wrote: Reason for CRM:pt had a ultrasound on her left knee and she has the report. Pt has fluid on her knee and would like to see any provider asap. Pt does not have blood clot. Pt had breast surgery and her surgeon order the ultrasound. Pt is having some discomfort walking x about 2 month

## 2020-04-24 NOTE — Telephone Encounter (Signed)
Called pt left VM to call back . Name and contact info provided.

## 2020-04-25 ENCOUNTER — Other Ambulatory Visit: Payer: Self-pay

## 2020-04-25 ENCOUNTER — Emergency Department (HOSPITAL_COMMUNITY)
Admission: EM | Admit: 2020-04-25 | Discharge: 2020-04-26 | Disposition: A | Discharge status: Home / Self Care | Payer: Medicare Other | Attending: Emergency Medicine | Admitting: Emergency Medicine

## 2020-04-25 ENCOUNTER — Encounter (HOSPITAL_COMMUNITY): Payer: Self-pay | Admitting: *Deleted

## 2020-04-25 DIAGNOSIS — I10 Essential (primary) hypertension: Secondary | ICD-10-CM | POA: Diagnosis not present

## 2020-04-25 DIAGNOSIS — R748 Abnormal levels of other serum enzymes: Secondary | ICD-10-CM

## 2020-04-25 DIAGNOSIS — R1084 Generalized abdominal pain: Secondary | ICD-10-CM | POA: Insufficient documentation

## 2020-04-25 DIAGNOSIS — R739 Hyperglycemia, unspecified: Secondary | ICD-10-CM

## 2020-04-25 DIAGNOSIS — Z79899 Other long term (current) drug therapy: Secondary | ICD-10-CM | POA: Insufficient documentation

## 2020-04-25 DIAGNOSIS — R109 Unspecified abdominal pain: Secondary | ICD-10-CM

## 2020-04-25 DIAGNOSIS — K219 Gastro-esophageal reflux disease without esophagitis: Secondary | ICD-10-CM | POA: Diagnosis not present

## 2020-04-25 DIAGNOSIS — D649 Anemia, unspecified: Secondary | ICD-10-CM

## 2020-04-25 DIAGNOSIS — R17 Unspecified jaundice: Secondary | ICD-10-CM

## 2020-04-25 DIAGNOSIS — R7401 Elevation of levels of liver transaminase levels: Secondary | ICD-10-CM

## 2020-04-25 DIAGNOSIS — R7309 Other abnormal glucose: Secondary | ICD-10-CM

## 2020-04-25 LAB — COMPREHENSIVE METABOLIC PANEL
ALT: 99 U/L — ABNORMAL HIGH (ref 0–44)
AST: 156 U/L — ABNORMAL HIGH (ref 15–41)
Albumin: 4.1 g/dL (ref 3.5–5.0)
Alkaline Phosphatase: 86 U/L (ref 38–126)
Anion gap: 8 (ref 5–15)
BUN: 14 mg/dL (ref 6–20)
CO2: 28 mmol/L (ref 22–32)
Calcium: 8.9 mg/dL (ref 8.9–10.3)
Chloride: 102 mmol/L (ref 98–111)
Creatinine, Ser: 0.8 mg/dL (ref 0.44–1.00)
GFR, Estimated: >60 mL/min (ref >60)
Glucose, Bld: 158 mg/dL — ABNORMAL HIGH (ref 70–99)
Potassium: 3.9 mmol/L (ref 3.5–5.1)
Sodium: 138 mmol/L (ref 135–145)
Total Bilirubin: 1.3 mg/dL — ABNORMAL HIGH (ref 0.3–1.2)
Total Protein: 7.5 g/dL (ref 6.5–8.1)

## 2020-04-25 LAB — CBC WITH DIFFERENTIAL/PLATELET
Abs Immature Granulocytes: 0.03 10*3/uL (ref 0.00–0.07)
Basophils Absolute: 0 10*3/uL (ref 0.0–0.1)
Basophils Relative: 0 %
Eosinophils Absolute: 0 10*3/uL (ref 0.0–0.5)
Eosinophils Relative: 0 %
HCT: 37.4 % (ref 36.0–46.0)
Hemoglobin: 11.7 g/dL — ABNORMAL LOW (ref 12.0–15.0)
Immature Granulocytes: 0 %
Lymphocytes Relative: 20 %
Lymphs Abs: 2.1 10*3/uL (ref 0.7–4.0)
MCH: 31.3 pg (ref 26.0–34.0)
MCHC: 31.3 g/dL (ref 30.0–36.0)
MCV: 100 fL (ref 80.0–100.0)
Monocytes Absolute: 0.8 10*3/uL (ref 0.1–1.0)
Monocytes Relative: 8 %
Neutro Abs: 7.5 10*3/uL (ref 1.7–7.7)
Neutrophils Relative %: 72 %
Platelets: 263 10*3/uL (ref 150–400)
RBC: 3.74 MIL/uL — ABNORMAL LOW (ref 3.87–5.11)
RDW: 13.3 % (ref 11.5–15.5)
WBC: 10.5 10*3/uL (ref 4.0–10.5)
nRBC: 0 % (ref 0.0–0.2)

## 2020-04-25 LAB — LIPASE, BLOOD: Lipase: 111 U/L — ABNORMAL HIGH (ref 11–51)

## 2020-04-25 MED ORDER — SODIUM CHLORIDE 0.9 % IV SOLN: INTRAVENOUS | Status: DC

## 2020-04-25 MED ORDER — ALUM & MAG HYDROXIDE-SIMETH 200-200-20 MG/5ML PO SUSP
30.0000 mL | Freq: Once | ORAL | Status: AC
  Administered 2020-04-25: 30 mL via ORAL
  Filled 2020-04-25: qty 30

## 2020-04-25 MED ORDER — SUCRALFATE 1 G PO TABS
1.0000 g | ORAL_TABLET | Freq: Once | ORAL | Status: AC
  Administered 2020-04-25: 1 g via ORAL
  Filled 2020-04-25: qty 1

## 2020-04-25 MED ORDER — FAMOTIDINE 20 MG PO TABS
40.0000 mg | ORAL_TABLET | Freq: Once | ORAL | Status: AC
  Administered 2020-04-25: 40 mg via ORAL
  Filled 2020-04-25: qty 2

## 2020-04-25 MED ORDER — ONDANSETRON 8 MG PO TBDP
8.0000 mg | ORAL_TABLET | Freq: Once | ORAL | Status: AC
  Administered 2020-04-25: 8 mg via ORAL
  Filled 2020-04-25: qty 1

## 2020-04-25 MED ORDER — HYDROMORPHONE HCL 1 MG/ML IJ SOLN
1.0000 mg | Freq: Once | INTRAMUSCULAR | Status: AC
  Administered 2020-04-25: 1 mg via INTRAVENOUS
  Filled 2020-04-25: qty 1

## 2020-04-25 NOTE — ED Triage Notes (Signed)
Pt complaining of pain to her left knee when she was at home and unloading groceries. Then she developed abdominal pain and nausea. Pt noted to have mildly clammy skin and is questionable to be pale.

## 2020-04-25 NOTE — ED Provider Notes (Signed)
[facility] DEPT Provider Note   CSN: [id number] Arrival date & time: 04/25/20  2042     History No chief complaint on file.   Sheila Yoder is a 54 y.o. female.  54 year old female presents with abdominal discomfort which she describes as diffuse.  States that she just started bring her diet today which involves eating limited calories.  States that she has had chronic left knee pain.  And she took Tylenol with codeine as well as Motrin which exacerbated her abdominal discomfort.  Denies any emesis with this.  No fever or chills.  No treatment use for this prior to arrival        Past Medical History:  Diagnosis Date  . Anemia   . Anxiety    self reported  . Depression    self reported  . GERD (gastroesophageal reflux disease)   . Hemorrhoids   . History of Helicobacter pylori infection    per patient report  . Hypertension   . Insomnia   . Obesity   . Peri-menopause 08/23/2014  . Screening for STD (sexually transmitted disease) 08/23/2014    Patient Active Problem List   Diagnosis Date Noted  . Colon cancer screening   . Benign neoplasm of cecum   . Benign neoplasm of ascending colon   . Diarrhea   . Dysphagia   . Esophageal stricture   . Gastroesophageal reflux disease   . Plantar fasciitis of right foot 05/30/2019  . Gastroesophageal reflux disease without esophagitis 07/15/2017  . Peri-menopause 08/23/2014  . Screening for STD (sexually transmitted disease) 08/23/2014    Past Surgical History:  Procedure Laterality Date  . BALLOON DILATION N/A 08/15/2019   Procedure: BALLOON DILATION;  Surgeon: Irene Shipper, MD;  Location: [facility];  Service: Endoscopy;  Laterality: N/A;  . BIOPSY  08/15/2019   Procedure: BIOPSY;  Surgeon: Irene Shipper, MD;  Location: [facility];  Service: Endoscopy;;  EGD and COLON  . BREAST REDUCTION SURGERY    . CESAREAN SECTION    . CHOLECYSTECTOMY    . COLONOSCOPY WITH PROPOFOL N/A 08/15/2019    Procedure: COLONOSCOPY WITH PROPOFOL;  Surgeon: Irene Shipper, MD;  Location: [facility];  Service: Endoscopy;  Laterality: N/A;  . ESOPHAGEAL DILATION  2013   in [place]   . ESOPHAGOGASTRODUODENOSCOPY (EGD) WITH PROPOFOL N/A 08/15/2019   Procedure: ESOPHAGOGASTRODUODENOSCOPY (EGD) WITH PROPOFOL;  Surgeon: Irene Shipper, MD;  Location: [facility];  Service: Endoscopy;  Laterality: N/A;  . POLYPECTOMY  08/15/2019   Procedure: POLYPECTOMY;  Surgeon: Irene Shipper, MD;  Location: [facility];  Service: Endoscopy;;  . REDUCTION MAMMAPLASTY Bilateral 2000  . TUBAL LIGATION       OB History    Gravida  2   Para  2   Term  2   Preterm      AB      Living  2     SAB      TAB      Ectopic      Multiple      Live Births              Family History  Problem Relation Age of Onset  . Diabetes Other        maternal nephew  . Hypertension Mother   . Diabetes Mother   . Arthritis Mother        rheumatoid  . Hyperlipidemia Mother   . Hypertension Sister   . Diabetes Sister   .  Other Brother        hunting accident  . Other Daughter        crohn's disease  . Hypertension Sister   . Diabetes Sister   . Hypertension Sister   . Hypertension Sister   . Hypertension Sister   . Lupus Sister   . Other Brother        fell off building  . Diabetes Brother   . Hypertension Brother   . Hypertension Brother   . Stomach cancer Neg Hx   . Esophageal cancer Neg Hx   . Pancreatic cancer Neg Hx   . Colon cancer Neg Hx   . Rectal cancer Neg Hx   . Colon polyps Neg Hx     Social History   Tobacco Use  . Smoking status: Never Smoker  . Smokeless tobacco: Never Used  Vaping Use  . Vaping Use: Never used  Substance Use Topics  . Alcohol use: No  . Drug use: No    Home Medications Prior to Admission medications   Medication Sig Start Date End Date Taking? Authorizing Provider  acetaminophen (TYLENOL) 500 MG tablet Take 1,000 mg by mouth 2 (two) times daily as  needed for headache.    [provider]  amLODipine (NORVASC) 5 MG tablet Take 2 tablets (10 mg total) by mouth daily. 11/02/19 01/31/20  Gildardo Pounds, NP  Ascorbic Acid (VITAMIN C WITH ROSE HIPS) 500 MG tablet Take 1,500 mg by mouth daily.    [provider]  Blood Pressure Monitor DEVI Please provide patient with insurance (MEDICAID) approved blood pressure monitor ICD I10 11/02/19   Gildardo Pounds, NP  buPROPion (WELLBUTRIN XL) 150 MG 24 hr tablet Take 1 tablet (150 mg total) by mouth daily. 11/02/19 01/31/20  Gildardo Pounds, NP  diphenhydrAMINE (BENADRYL) 25 MG tablet Take 25 mg by mouth 2 (two) times daily.    [provider]  Ferrous Sulfate (IRON) 325 (65 Fe) MG TABS Take 325 mg by mouth 2 (two) times daily.     [provider]  Fexofenadine HCl (MUCINEX ALLERGY PO) Take 1 tablet by mouth as needed.    [provider]  fluticasone (FLONASE) 50 MCG/ACT nasal spray Place 2 sprays into both nostrils daily. 11/02/19   Gildardo Pounds, NP  ibuprofen (ADVIL) 200 MG tablet Take 200 mg by mouth every 6 (six) hours as needed.    [provider]  misoprostol (CYTOTEC) 200 MCG tablet Take 3 pills by mouth the night before biopsy. Patient not taking: Reported on 11/02/2019 09/01/19   Emily Filbert, MD  Omega-3 Fatty Acids (OMEGA 3 PO) Take 1 capsule by mouth 2 (two) times daily.  Patient not taking: Reported on 01/09/2020    [provider]  omeprazole (PRILOSEC) 40 MG capsule Take 1 capsule (40 mg total) by mouth daily. 02/03/20   Charlott Rakes, MD  oxymetazoline (AFRIN) 0.05 % nasal spray Place 1 spray into both nostrils 2 (two) times daily as needed for congestion.    [provider]  pravastatin (PRAVACHOL) 40 MG tablet Take 1 tablet (40 mg total) by mouth daily. 01/09/20   Gildardo Pounds, NP  Probiotic Product (PROBIOTIC PO) Take 1 capsule by mouth daily.    [provider]  traZODone (DESYREL) 100 MG tablet Take 100 mg by  mouth at bedtime as needed for sleep.    [provider]  Vitamin D, Ergocalciferol, (DRISDOL) 1.25 MG (50000 UNIT) CAPS capsule Take 50,000 Units  by mouth every Friday.    [provider]  Zinc 50 MG TABS Take 50 mg by mouth daily.    [provider]  esomeprazole (NEXIUM) 40 MG capsule Take 1 capsule (40 mg total) by mouth daily at 12 noon. 01/05/19 04/02/19  Gildardo Pounds, NP    Allergies    Bactrim [sulfamethoxazole-trimethoprim]  Review of Systems   Review of Systems  All other systems reviewed and are negative.   Physical Exam Updated Vital Signs BP 96/60 (BP Location: Left Arm)   Pulse 77   Temp (!) 97.5 F (36.4 C) (Oral)   Resp 16   SpO2 94%   Physical Exam Vitals and nursing note reviewed.  Constitutional:      General: She is not in acute distress.    Appearance: Normal appearance. She is well-developed. She is not toxic-appearing.  HENT:     Head: Normocephalic and atraumatic.  Eyes:     General: Lids are normal.     Conjunctiva/sclera: Conjunctivae normal.     Pupils: Pupils are equal, round, and reactive to light.  Neck:     Thyroid: No thyroid mass.     Trachea: No tracheal deviation.  Cardiovascular:     Rate and Rhythm: Normal rate and regular rhythm.     Heart sounds: Normal heart sounds. No murmur heard.  No gallop.   Pulmonary:     Effort: Pulmonary effort is normal. No respiratory distress.     Breath sounds: Normal breath sounds. No stridor. No decreased breath sounds, wheezing, rhonchi or rales.  Abdominal:     General: Bowel sounds are normal. There is no distension.     Palpations: Abdomen is soft.     Tenderness: There is no abdominal tenderness. There is no rebound.  Musculoskeletal:        General: No tenderness. Normal range of motion.     Cervical back: Normal range of motion and neck supple.       Legs:  Skin:    General: Skin is warm and dry.     Findings: No abrasion or rash.  Neurological:     Mental  Status: She is alert and oriented to person, place, and time.     GCS: GCS eye subscore is 4. GCS verbal subscore is 5. GCS motor subscore is 6.     Cranial Nerves: No cranial nerve deficit.     Sensory: No sensory deficit.  Psychiatric:        Speech: Speech normal.        Behavior: Behavior normal.     ED Results / Procedures / Treatments   Labs (all labs ordered are listed, but only abnormal results are displayed) Labs Reviewed - No data to display  EKG None  Radiology No results found.  Procedures Procedures (including critical care time)  Medications Ordered in ED Medications  alum & mag hydroxide-simeth (MAALOX/MYLANTA) 200-200-20 MG/5ML suspension 30 mL (has no administration in time range)  sucralfate (CARAFATE) tablet 1 g (has no administration in time range)  famotidine (PEPCID) tablet 40 mg (has no administration in time range)    ED Course  I have reviewed the triage vital signs and the nursing notes.  Pertinent labs & imaging results that were available during my care of the patient were reviewed by me and considered in my medical decision making (see chart for details).    MDM Rules/Calculators/A&P  Patient given GI cocktail and Carafate for abdominal pain thought to be caused by the medication she took.  Patient reassessed and continues to note discomfort.  Will order labs as well as abdominal CT.  Will sign out to next provider Final Clinical Impression(s) / ED Diagnoses Final diagnoses:  None    Rx / DC Orders ED Discharge Orders    None       Lacretia Leigh, MD 04/25/20 2243

## 2020-04-25 NOTE — ED Notes (Signed)
Pt c/o lower abd. Pain with nausea 1 hour PTA. Pt states she started a diet  to day . Pt denies  trauma or urinary symptoms. Pt states she had BM last night

## 2020-04-26 ENCOUNTER — Encounter (HOSPITAL_COMMUNITY): Payer: Self-pay

## 2020-04-26 ENCOUNTER — Emergency Department (HOSPITAL_COMMUNITY): Payer: Medicare Other

## 2020-04-26 DIAGNOSIS — R1084 Generalized abdominal pain: Secondary | ICD-10-CM | POA: Diagnosis not present

## 2020-04-26 MED ORDER — PROCHLORPERAZINE MALEATE 10 MG PO TABS: 10.0000 mg | ORAL_TABLET | Freq: Four times a day (QID) | ORAL | 0 refills | Status: DC | PRN

## 2020-04-26 MED ORDER — OXYCODONE HCL 5 MG PO TABS
5.0000 mg | ORAL_TABLET | ORAL | 0 refills | Status: DC | PRN
Start: 2020-04-26 — End: 2023-08-10

## 2020-04-26 MED ORDER — OXYCODONE HCL 5 MG PO TABS
5.0000 mg | ORAL_TABLET | ORAL | 0 refills | Status: DC | PRN
Start: 2020-04-26 — End: 2020-04-26

## 2020-04-26 MED ORDER — IOHEXOL 300 MG/ML  SOLN
100.0000 mL | Freq: Once | INTRAMUSCULAR | Status: AC | PRN
  Administered 2020-04-26: 100 mL via INTRAVENOUS

## 2020-04-26 MED ORDER — SODIUM CHLORIDE (PF) 0.9 % IJ SOLN
INTRAMUSCULAR | Status: AC
  Filled 2020-04-26: qty 50

## 2020-04-26 MED ORDER — PROCHLORPERAZINE EDISYLATE 10 MG/2ML IJ SOLN
10.0000 mg | Freq: Once | INTRAMUSCULAR | Status: AC
  Administered 2020-04-26: 10 mg via INTRAVENOUS
  Filled 2020-04-26: qty 2

## 2020-04-26 NOTE — Discharge Instructions (Addendum)
There were some abnormal blood tests related to your pancreas and liver.  It is possible this is related to your cholesterol medication.  Please follow-up with your gastroenterologist for further evaluation.  However, continue taking your cholesterol medicine until told to change it.  Your blood sugar was slightly elevated today - 158.  This will need to be followed closely to make sure that you are not developing diabetes.  Return if your symptoms are getting worse.

## 2020-04-26 NOTE — ED Provider Notes (Signed)
Care assumed from Dr. Zenia Resides, patient with abdominal pain pending labs and CT abdomen and pelvis.  CT abdomen and pelvis did not show any significant pathology.  Labs are significant for mild to moderate elevation of transaminases and lipase as well as minimal elevation of bilirubin.  Glucose is also mildly elevated.  None of these have been present on prior lab tests, including as recently as 03/14/2020.  Exam was repeated and there is no significant abdominal tenderness.  Patient was recently started on atorvastatin and is possible that all lab abnormalities are secondary to this.  However, since lab values are only mildly elevated, will continue atorvastatin and refer back to her gastroenterologist for further outpatient work-up.  She is discharged with prescriptions for prochlorperazine and a small number of oxycodone tablets.  She is referred back to her primary care provider and her gastroenterologist.  Return precautions discussed.  Results for orders placed or performed during the hospital encounter of 04/25/20  CBC with Differential/Platelet  Result Value Ref Range   WBC 10.5 4.0 - 10.5 K/uL   RBC 3.74 (L) 3.87 - 5.11 MIL/uL   Hemoglobin 11.7 (L) 12.0 - 15.0 g/dL   HCT 37.4 36 - 46 %   MCV 100.0 80.0 - 100.0 fL   MCH 31.3 26.0 - 34.0 pg   MCHC 31.3 30.0 - 36.0 g/dL   RDW 13.3 11.5 - 15.5 %   Platelets 263 150 - 400 K/uL   nRBC 0.0 0.0 - 0.2 %   Neutrophils Relative % 72 %   Neutro Abs 7.5 1.7 - 7.7 K/uL   Lymphocytes Relative 20 %   Lymphs Abs 2.1 0.7 - 4.0 K/uL   Monocytes Relative 8 %   Monocytes Absolute 0.8 0.1 - 1.0 K/uL   Eosinophils Relative 0 %   Eosinophils Absolute 0.0 0.0 - 0.5 K/uL   Basophils Relative 0 %   Basophils Absolute 0.0 0.0 - 0.1 K/uL   Immature Granulocytes 0 %   Abs Immature Granulocytes 0.03 0.00 - 0.07 K/uL  Comprehensive metabolic panel  Result Value Ref Range   Sodium 138 135 - 145 mmol/L   Potassium 3.9 3.5 - 5.1 mmol/L   Chloride 102 98 - 111  mmol/L   CO2 28 22 - 32 mmol/L   Glucose, Bld 158 (H) 70 - 99 mg/dL   BUN 14 6 - 20 mg/dL   Creatinine, Ser 0.80 0.44 - 1.00 mg/dL   Calcium 8.9 8.9 - 10.3 mg/dL   Total Protein 7.5 6.5 - 8.1 g/dL   Albumin 4.1 3.5 - 5.0 g/dL   AST 156 (H) 15 - 41 U/L   ALT 99 (H) 0 - 44 U/L   Alkaline Phosphatase 86 38 - 126 U/L   Total Bilirubin 1.3 (H) 0.3 - 1.2 mg/dL   GFR, Estimated >60 >60 mL/min   Anion gap 8 5 - 15  Lipase, blood  Result Value Ref Range   Lipase 111 (H) 11 - 51 U/L   CT Abdomen Pelvis W Contrast  Result Date: 04/26/2020 CLINICAL DATA:  Abdominal distension EXAM: CT ABDOMEN AND PELVIS WITH CONTRAST TECHNIQUE: Multidetector CT imaging of the abdomen and pelvis was performed using the standard protocol following bolus administration of intravenous contrast. CONTRAST:  172mL OMNIPAQUE IOHEXOL 300 MG/ML  SOLN COMPARISON:  None. FINDINGS: Lower chest: The visualized heart size within normal limits. No pericardial fluid/thickening. No hiatal hernia. The visualized portions of the lungs are clear. Hepatobiliary: There is a 2 cm low-density lesion seen  in the posterior right liver lobe. The patient is status post cholecystectomy. No biliary ductal dilation. Pancreas:  Unremarkable.  No surrounding inflammatory changes. Spleen: Normal in size. Although limited due to the lack of intravenous contrast, normal in appearance. Adrenals/Urinary Tract: Both adrenal glands appear normal. There is a 3 mm calculus seen in the lower pole of the right kidney. No left-sided renal or collecting system calculi are seen. The bladder is unremarkable. Bladder is unremarkable. Stomach/Bowel: The stomach, small bowel, and colon are normal in appearance. No inflammatory changes or obstructive findings. A moderate amount of colonic stool is seen throughout. Appendix is normal. Vascular/Lymphatic: There are no enlarged abdominal or pelvic lymph nodes. No significant gross vascular findings are present given the lack of  intravenous contrast. Reproductive: The uterus and adnexa are unremarkable. Other: No evidence of abdominal wall mass or hernia. Musculoskeletal: No acute or significant osseous findings. IMPRESSION: Moderate amount of colonic stool without evidence of obstruction. Nonobstructing right renal calculus. Electronically Signed   By: Prudencio Pair M.D.   On: 04/26/2020 00:27   Images viewed by me.    Delora Fuel, MD 29/56/21 [phone]

## 2020-04-26 NOTE — ED Notes (Signed)
Son in room with pt.

## 2020-05-01 DIAGNOSIS — I10 Essential (primary) hypertension: Secondary | ICD-10-CM | POA: Insufficient documentation

## 2020-05-02 DIAGNOSIS — R7303 Prediabetes: Secondary | ICD-10-CM | POA: Insufficient documentation

## 2020-05-11 DIAGNOSIS — J3089 Other allergic rhinitis: Secondary | ICD-10-CM | POA: Insufficient documentation

## 2020-05-11 DIAGNOSIS — J3489 Other specified disorders of nose and nasal sinuses: Secondary | ICD-10-CM | POA: Insufficient documentation

## 2020-06-11 ENCOUNTER — Ambulatory Visit: Payer: Medicare Other | Admitting: Family Medicine

## 2020-07-05 DIAGNOSIS — M950 Acquired deformity of nose: Secondary | ICD-10-CM | POA: Insufficient documentation

## 2020-08-03 ENCOUNTER — Other Ambulatory Visit (HOSPITAL_BASED_OUTPATIENT_CLINIC_OR_DEPARTMENT_OTHER): Payer: Self-pay

## 2020-08-03 DIAGNOSIS — G4710 Hypersomnia, unspecified: Secondary | ICD-10-CM

## 2020-08-03 DIAGNOSIS — R0683 Snoring: Secondary | ICD-10-CM

## 2020-08-03 DIAGNOSIS — R0681 Apnea, not elsewhere classified: Secondary | ICD-10-CM

## 2020-08-16 ENCOUNTER — Ambulatory Visit: Payer: Medicare Other | Admitting: Physician Assistant

## 2020-08-28 ENCOUNTER — Ambulatory Visit (INDEPENDENT_AMBULATORY_CARE_PROVIDER_SITE_OTHER): Payer: Medicare Other | Admitting: Neurology

## 2020-08-28 ENCOUNTER — Encounter: Payer: Self-pay | Admitting: Neurology

## 2020-08-28 VITALS — BP 132/82 | HR 86 | Ht 66.0 in | Wt 331.0 lb

## 2020-08-28 DIAGNOSIS — J343 Hypertrophy of nasal turbinates: Secondary | ICD-10-CM

## 2020-08-28 DIAGNOSIS — R635 Abnormal weight gain: Secondary | ICD-10-CM

## 2020-08-28 DIAGNOSIS — Z6841 Body Mass Index (BMI) 40.0 and over, adult: Secondary | ICD-10-CM

## 2020-08-28 DIAGNOSIS — R519 Headache, unspecified: Secondary | ICD-10-CM

## 2020-08-28 DIAGNOSIS — G4719 Other hypersomnia: Secondary | ICD-10-CM | POA: Diagnosis not present

## 2020-08-28 DIAGNOSIS — J342 Deviated nasal septum: Secondary | ICD-10-CM | POA: Diagnosis not present

## 2020-08-28 DIAGNOSIS — R0683 Snoring: Secondary | ICD-10-CM | POA: Diagnosis not present

## 2020-08-28 DIAGNOSIS — R351 Nocturia: Secondary | ICD-10-CM

## 2020-08-28 NOTE — Progress Notes (Signed)
Subjective:    Patient ID: Sheila Yoder is a 55 y.o. female.  HPI     Star Age, MD, PhD [facility] [address] [place], [place] [address]  Dear Sheila Yoder,  I saw your patient, Sheila Yoder, on your kind request in the clinic today for initial consultation of her sleep disorder, in particular, concern for underlying obstructive sleep apnea.  The patient is unaccompanied today.  As you know, Sheila Yoder is a 55 year old right-handed woman with an underlying medical history of prediabetes, hyperlipidemia, anemia, anxiety, depression, reflux disease, hypertension, and severe obesity with a BMI of over 50, who reports snoring and excessive daytime somnolence.  Her Epworth sleepiness score is 12/24. She reports a longstanding history of snoring but has had more progressive difficulty with nasal congestion and difficulty breathing through her nose.  She had a recent consultation with an ENT specialist in [place] and was encouraged to pursue sleep testing.  She was diagnosed with a deviated nasal septum.  Surgery was discussed.  She reports that per family, her snoring is loud but no one has mentioned pauses in her breathing and she does not wake up gasping for air.  She has woken up with a headache more frequently lately.  This is a dull and achy headache.  She has nocturia at least twice per average night.  She has sleep disruption and some difficulty going to sleep and more difficulty staying asleep.  She is generally in bed around 930 or 10 PM and she may get out of bed around 3 AM.  She has trouble going back to sleep after she goes to the bathroom.  She lives with her children ages 34 and 90 and a 71-year-old grandson.  She is a non-smoker and does not drink alcohol and does not drink caffeine daily, may be soda twice a week.  She does not work, she is on disability.  She is not aware of any family history of sleep apnea.  She reports weight gain, she has  had some stressors lately, deaths in the family.  She has a prescription for oxycodone for foot pain.  She does not take it regularly.  She does not have a TV in her bedroom and does not have any pets in the house.  Her Past Medical History Is Significant For: Past Medical History:  Diagnosis Date  . Anemia   . Anxiety    self reported  . Depression    self reported  . GERD (gastroesophageal reflux disease)   . Hemorrhoids   . History of Helicobacter pylori infection    per patient report  . Hypertension   . Insomnia   . Obesity   . Peri-menopause 08/23/2014  . Screening for STD (sexually transmitted disease) 08/23/2014    Her Past Surgical History Is Significant For: Past Surgical History:  Procedure Laterality Date  . BALLOON DILATION N/A 08/15/2019   Procedure: BALLOON DILATION;  Surgeon: Irene Shipper, MD;  Location: [facility];  Service: Endoscopy;  Laterality: N/A;  . BIOPSY  08/15/2019   Procedure: BIOPSY;  Surgeon: Irene Shipper, MD;  Location: [facility];  Service: Endoscopy;;  EGD and COLON  . BREAST REDUCTION SURGERY    . CESAREAN SECTION    . CHOLECYSTECTOMY    . COLONOSCOPY WITH PROPOFOL N/A 08/15/2019   Procedure: COLONOSCOPY WITH PROPOFOL;  Surgeon: Irene Shipper, MD;  Location: [facility];  Service: Endoscopy;  Laterality: N/A;  . ESOPHAGEAL DILATION  2013   in [place]   . ESOPHAGOGASTRODUODENOSCOPY (EGD) WITH PROPOFOL N/A 08/15/2019   Procedure: ESOPHAGOGASTRODUODENOSCOPY (EGD) WITH PROPOFOL;  Surgeon: Irene Shipper, MD;  Location: [facility];  Service: Endoscopy;  Laterality: N/A;  . POLYPECTOMY  08/15/2019   Procedure: POLYPECTOMY;  Surgeon: Irene Shipper, MD;  Location: [facility];  Service: Endoscopy;;  . REDUCTION MAMMAPLASTY Bilateral 2000  . TUBAL LIGATION      Her Family History Is Significant For: Family History  Problem Relation Age of Onset  . Diabetes Other        maternal nephew  . Hypertension Mother   . Diabetes Mother   .  Arthritis Mother        rheumatoid  . Hyperlipidemia Mother   . Hypertension Sister   . Diabetes Sister   . Other Brother        hunting accident  . Other Daughter        crohn's disease  . Hypertension Sister   . Diabetes Sister   . Hypertension Sister   . Hypertension Sister   . Hypertension Sister   . Lupus Sister   . Other Brother        fell off building  . Diabetes Brother   . Hypertension Brother   . Hypertension Brother   . Stomach cancer Neg Hx   . Esophageal cancer Neg Hx   . Pancreatic cancer Neg Hx   . Colon cancer Neg Hx   . Rectal cancer Neg Hx   . Colon polyps Neg Hx     Her Social History Is Significant For: Social History   Socioeconomic History  . Marital status: Single    Spouse name: Not on file  . Number of children: Not on file  . Years of education: Not on file  . Highest education level: Not on file  Occupational History  . Not on file  Tobacco Use  . Smoking status: Never Smoker  . Smokeless tobacco: Never Used  Vaping Use  . Vaping Use: Never used  Substance and Sexual Activity  . Alcohol use: No  . Drug use: No  . Sexual activity: Not Currently    Birth control/protection: Surgical    Comment: tubal  Other Topics Concern  . Not on file  Social History Narrative  . Not on file   Social Determinants of Health   Financial Resource Strain: Not on file  Food Insecurity: Not on file  Transportation Needs: Not on file  Physical Activity: Not on file  Stress: Not on file  Social Connections: Not on file    Her Allergies Are:  Allergies  Allergen Reactions  . Latex Rash  . Bactrim [Sulfamethoxazole-Trimethoprim] Other (See Comments)    Chest pain  :   Her Current Medications Are:  Outpatient Encounter Medications as of 08/28/2020  Medication Sig  . acetaminophen (TYLENOL) 500 MG tablet Take 1,000 mg by mouth 2 (two) times daily as needed for headache.  Marland Kitchen amLODipine (NORVASC) 10 MG tablet Take 10 mg by mouth daily.  .  Ascorbic Acid (VITAMIN C WITH ROSE HIPS) 500 MG tablet Take 1,500 mg by mouth daily.  . Cholecalciferol (DIALYVITE VITAMIN D 5000 PO) Take 5,000 Units by mouth daily.  . fluticasone (FLONASE) 50 MCG/ACT nasal spray Place 2 sprays into both nostrils daily.  . Garlic 7829 MG CAPS Take 1,000 mg by mouth daily.  . lansoprazole (PREVACID) 30 MG capsule Take 30 mg by mouth daily at 12 noon.  . Probiotic  Product (PROBIOTIC PO) Take 1 capsule by mouth daily.  . Zinc 50 MG TABS Take 50 mg by mouth daily.  . [DISCONTINUED] buPROPion (WELLBUTRIN XL) 150 MG 24 hr tablet Take 1 tablet (150 mg total) by mouth daily.  Marland Kitchen atorvastatin (LIPITOR) 40 MG tablet Take 40 mg by mouth daily. (Patient not taking: Reported on 08/28/2020)  . Ferrous Sulfate (IRON) 325 (65 Fe) MG TABS Take 325 mg by mouth daily.  (Patient not taking: Reported on 08/28/2020)  . Fexofenadine HCl (MUCINEX ALLERGY PO) Take 1 tablet by mouth daily as needed (allergy).  (Patient not taking: Reported on 08/28/2020)  . oxyCODONE (ROXICODONE) 5 MG immediate release tablet Take 1 tablet (5 mg total) by mouth every 4 (four) hours as needed for severe pain. (Patient not taking: Reported on 08/28/2020)  . prochlorperazine (COMPAZINE) 10 MG tablet Take 1 tablet (10 mg total) by mouth every 6 (six) hours as needed for nausea or vomiting. (Patient not taking: Reported on 08/28/2020)  . [DISCONTINUED] Blood Pressure Monitor DEVI Please provide patient with insurance (MEDICAID) approved blood pressure monitor ICD I10  . [DISCONTINUED] esomeprazole (NEXIUM) 40 MG capsule Take 1 capsule (40 mg total) by mouth daily at 12 noon.  . [DISCONTINUED] misoprostol (CYTOTEC) 200 MCG tablet Take 3 pills by mouth the night before biopsy. (Patient not taking: Reported on 11/02/2019)  . [DISCONTINUED] omeprazole (PRILOSEC) 40 MG capsule Take 1 capsule (40 mg total) by mouth daily. (Patient not taking: Reported on 04/25/2020)  . [DISCONTINUED] pravastatin (PRAVACHOL) 40 MG tablet Take 1  tablet (40 mg total) by mouth daily. (Patient not taking: Reported on 04/25/2020)   No facility-administered encounter medications on file as of 08/28/2020.  :  Review of Systems:  Out of a complete 14 point review of systems, all are reviewed and negative with the exception of these symptoms as listed below: Review of Systems  Neurological:       Here for sleep consult. No prior sleep study pt reports she does snore at night. Epworth Sleepiness Scale 0= would never doze 1= slight chance of dozing 2= moderate chance of dozing 3= high chance of dozing  Sitting and reading: Watching TV: Sitting inactive in a public place (ex. Theater or meeting): As a passenger in a car for an hour without a break: Lying down to rest in the afternoon: Sitting and talking to someone: Sitting quietly after lunch (no alcohol): In a car, while stopped in traffic: Total:     Objective:  Neurological Exam  Physical Exam Physical Examination:   Vitals:   08/28/20 1238  BP: 132/82  Pulse: 86  SpO2: 97%    General Examination: The patient is a very pleasant 55 y.o. female in no acute distress. She appears well-developed and well-nourished.   HEENT: Normocephalic, atraumatic, pupils are equal, round and reactive to light, extraocular tracking is good without limitation to gaze excursion or nystagmus noted. Hearing is grossly intact. Face is symmetric with normal facial animation. Speech is clear with no dysarthria noted. There is no hypophonia. There is no lip, neck/head, jaw or voice tremor. Neck is supple with full range of passive and active motion. There are no carotid bruits on auscultation. Oropharynx exam reveals: mild mouth dryness, adequate dental hygiene with several teeth missing, moderate airway crowding secondary to tonsillar size of 1+, wider tongue and wider uvula noted, Mallampati class II, neck circumference of 17 5/8 inches.  She has a minimal overbite.  Tongue protrudes centrally in  palate elevates symmetrically.  Nasal inspection reveals significant deviated septum to the left and bilateral inferior turbinate hypertrophy.    Chest: Clear to auscultation without wheezing, rhonchi or crackles noted.  Heart: S1+S2+0, regular and normal without murmurs, rubs or gallops noted.   Abdomen: Soft, non-tender and non-distended with normal bowel sounds appreciated on auscultation.  Extremities: There is no pitting edema in the distal lower extremities bilaterally.   Skin: Warm and dry without trophic changes noted.   Musculoskeletal: exam reveals no obvious joint deformities, tenderness or joint swelling or erythema.   Neurologically:  Mental status: The patient is awake, alert and oriented in all 4 spheres. Her immediate and remote memory, attention, language skills and fund of knowledge are appropriate. There is no evidence of aphasia, agnosia, apraxia or anomia. Speech is clear with normal prosody and enunciation. Thought process is linear. Mood is normal and affect is normal.  Cranial nerves II - XII are as described above under HEENT exam.  Motor exam: Normal bulk, strength and tone is noted. There is no tremor, Fine motor skills and coordination: grossly intact.  Cerebellar testing: No dysmetria or intention tremor. There is no truncal or gait ataxia.  Sensory exam: intact to light touch in the upper and lower extremities.  Gait, station and balance: She stands easily. No veering to one side is noted. No leaning to one side is noted. Posture is age-appropriate and stance is narrow based. Gait shows normal stride length and normal pace. No problems turning are noted.   Assessment and Plan:   In summary, Sheila Yoder is a very pleasant 55 y.o.-year old female with an underlying medical history of prediabetes, hyperlipidemia, anemia, anxiety, depression, reflux disease, hypertension, and severe obesity with a BMI of over 50, whose history and physical exam are concerning for  obstructive sleep apnea (OSA). I had a long chat with the patient about my findings and the diagnosis of OSA, its prognosis and treatment options. We talked about medical treatments, surgical interventions and non-pharmacological approaches. I explained in particular the risks and ramifications of untreated moderate to severe OSA, especially with respect to developing cardiovascular disease down the Road, including congestive heart failure, difficult to treat hypertension, cardiac arrhythmias, or stroke. Even type 2 diabetes has, in part, been linked to untreated OSA. Symptoms of untreated OSA include daytime sleepiness, memory problems, mood irritability and mood disorder such as depression and anxiety, lack of energy, as well as recurrent headaches, especially morning headaches. We talked about trying to maintain a healthy lifestyle in general, as well as the importance of weight control. We also talked about the importance of good sleep hygiene. I recommended the following at this time: sleep study.  I explained the sleep test procedure to the patient and also outlined possible surgical and non-surgical treatment options of OSA, including the use of a custom-made dental device (which would require a referral to a specialist dentist or oral surgeon), upper airway surgical options, such as traditional UPPP or a novel less invasive surgical option in the form of Inspire hypoglossal nerve stimulation (which would involve a referral to an ENT surgeon). I also explained the CPAP treatment option to the patient, who indicated that she would be willing to try CPAP if the need arises. I explained the importance of being compliant with PAP treatment, not only for insurance purposes but primarily to improve Her symptoms, and for the patient's long term health benefit, including to reduce Her cardiovascular risks. I answered all her questions today and the patient was  in agreement. I plan to see her back after the sleep  study is completed and encouraged her to call with any interim questions, concerns, problems or updates.   Thank you very much for allowing me to participate in the care of this nice patient. If I can be of any further assistance to you please do not hesitate to call me at [phone].  Sincerely,   Star Age, MD, PhD

## 2020-08-28 NOTE — Patient Instructions (Signed)

## 2020-09-03 ENCOUNTER — Telehealth: Payer: Self-pay

## 2020-09-03 NOTE — Telephone Encounter (Signed)
LVM for pt to call me back to schedule sleep study  

## 2020-09-10 ENCOUNTER — Ambulatory Visit: Payer: Medicare Other | Admitting: Neurology

## 2020-09-10 DIAGNOSIS — G4719 Other hypersomnia: Secondary | ICD-10-CM

## 2020-09-10 DIAGNOSIS — R635 Abnormal weight gain: Secondary | ICD-10-CM

## 2020-09-10 DIAGNOSIS — J342 Deviated nasal septum: Secondary | ICD-10-CM

## 2020-09-10 DIAGNOSIS — R351 Nocturia: Secondary | ICD-10-CM

## 2020-09-10 DIAGNOSIS — R0683 Snoring: Secondary | ICD-10-CM

## 2020-09-10 DIAGNOSIS — J343 Hypertrophy of nasal turbinates: Secondary | ICD-10-CM

## 2020-09-10 DIAGNOSIS — R519 Headache, unspecified: Secondary | ICD-10-CM

## 2020-09-16 ENCOUNTER — Ambulatory Visit (INDEPENDENT_AMBULATORY_CARE_PROVIDER_SITE_OTHER): Payer: Medicare Other | Admitting: Neurology

## 2020-09-16 DIAGNOSIS — G4719 Other hypersomnia: Secondary | ICD-10-CM

## 2020-09-16 DIAGNOSIS — G4733 Obstructive sleep apnea (adult) (pediatric): Secondary | ICD-10-CM | POA: Diagnosis not present

## 2020-09-16 DIAGNOSIS — R0683 Snoring: Secondary | ICD-10-CM

## 2020-09-16 DIAGNOSIS — J342 Deviated nasal septum: Secondary | ICD-10-CM

## 2020-09-16 DIAGNOSIS — R351 Nocturia: Secondary | ICD-10-CM

## 2020-09-16 DIAGNOSIS — R635 Abnormal weight gain: Secondary | ICD-10-CM

## 2020-09-16 DIAGNOSIS — R519 Headache, unspecified: Secondary | ICD-10-CM

## 2020-09-16 DIAGNOSIS — J343 Hypertrophy of nasal turbinates: Secondary | ICD-10-CM

## 2020-09-16 DIAGNOSIS — Z6841 Body Mass Index (BMI) 40.0 and over, adult: Secondary | ICD-10-CM

## 2020-09-19 ENCOUNTER — Encounter (HOSPITAL_BASED_OUTPATIENT_CLINIC_OR_DEPARTMENT_OTHER): Payer: Medicare Other | Admitting: Internal Medicine

## 2020-09-20 MED FILL — AMLODIPINE BESYLATE 5 MG TA: 5 | 90 days supply | Qty: 180 | Fill #1

## 2020-10-02 NOTE — Addendum Note (Signed)
Addended by: Star Age on: 10/02/2020 05:59 PM   Modules accepted: Orders

## 2020-10-02 NOTE — Procedures (Signed)
PATIENT'S NAME:  Sheila Yoder, Sheila Yoder DOB:      February 04, 1966      MR#:    [id number]     DATE OF RECORDING: 09/16/2020 REFERRING M.D.:  Sue Lush, PA-C Study Performed:   Baseline Polysomnogram HISTORY: 55 year old woman with a history of prediabetes, hyperlipidemia, anemia, anxiety, depression, reflux disease, hypertension, and severe obesity with a BMI of over 50, who reports snoring and excessive daytime somnolence. The patient endorsed the Epworth Sleepiness Scale at 12/24 points.  The patient's weight 331 pounds with a height of 66 (inches), resulting in a BMI of 53.1 kg/m2.  The patient's neck circumference measured 17.8 inches.  CURRENT MEDICATIONS: Tylenol, Norvasc, Vitamin C, Vitamin D, Flonase, Prevacid, Probiotic, Zinc   PROCEDURE:  This is a multichannel digital polysomnogram utilizing the Somnostar 11.2 system.  Electrodes and sensors were applied and monitored per AASM Specifications.   EEG, EOG, Chin and Limb EMG, were sampled at 200 Hz.  ECG, Snore and Nasal Pressure, Thermal Airflow, Respiratory Effort, CPAP Flow and Pressure, Oximetry was sampled at 50 Hz. Digital video and audio were recorded.      BASELINE STUDY  Lights Out was at 22:03 and Lights On at 05:00.  Total recording time (TRT) was 417.5 minutes, with a total sleep time (TST) of 140.5 minutes.   The patient's sleep latency to persistent sleep was 145 minutes, which is delayed.  REM sleep was absent. The sleep efficiency was 33.7%, which is markedly reduced.     SLEEP ARCHITECTURE: WASO (Wake after sleep onset) was 133 minutes with significant sleep fragmentation noted and several longer periods of wakefulness.  There were 17.5 minutes in Stage N1, 102 minutes Stage N2, 21 minutes Stage N3 and 0 minutes in Stage REM.  The percentage of Stage N1 was 12.5%, which is increased, Stage N2 was 72.6%, which is markedly increased, Stage N3 was 14.9% and Stage R (REM sleep) was absent. The arousals were noted as: 20 were spontaneous, 0  were associated with PLMs, 59 were associated with respiratory events.  RESPIRATORY ANALYSIS:  There were a total of 100 respiratory events:  2 obstructive apneas, 1 central apneas and 0 mixed apneas with a total of 3 apneas and an apnea index (AI) of 1.3 /hour. There were 97 hypopneas with a hypopnea index of 41.4 /hour. The patient also had 0 respiratory event related arousals (RERAs).      The total APNEA/HYPOPNEA INDEX (AHI) was 42.7/hour and the total RESPIRATORY DISTURBANCE INDEX was  42.7 /hour.  0 events occurred in REM sleep and 195 events in NREM. The REM AHI was n/a, versus a non-REM AHI of 42.7. The patient spent 0 minutes of total sleep time in the supine position and 141 minutes in non-supine.. The supine AHI was n/a versus a non-supine AHI of 42.7.  OXYGEN SATURATION & C02:  The Wake baseline 02 saturation was 96%, with the lowest being 82%. Time spent below 89% saturation equaled 13 minutes.   PERIODIC LIMB MOVEMENTS: The patient had a total of 0 Periodic Limb Movements.  The Periodic Limb Movement (PLM) index was 0 and the PLM Arousal index was 0/hour.  Audio and video analysis did not show any abnormal or unusual movements, behaviors, phonations or vocalizations. She was notably restless had significant difficulty maintaining sleep. The patient took 1 bathroom break. Mild to moderate snoring was noted. The EKG was in keeping with normal sinus rhythm (NSR).  Post-study, the patient indicated that sleep was the same as usual.  IMPRESSION: 1. Severe Obstructive Sleep Apnea (OSA) 2. Dysfunctions associated with sleep stages or arousal from sleep  RECOMMENDATIONS: 1. This study demonstrates severe obstructive sleep apnea, with a total AHI of 42.7/hour, and O2 nadir of 82%. The absence of supine and REM sleep likely underestimates her AHI and O2 nadir to some degree. Treatment with positive airway pressure is recommended. Given her significant difficulty initiating and maintaining  sleep in the sleep lab situation, the patient will be advise to start autoPAP therapy at home. A full night titration study may be considered to optimize therapy, if needed, down the road. Other treatment options may be limited, due to the severity of her sleep disordered breathing. Options may include - generally speaking - avoidance of supine sleep position along with weight loss, upper airway or jaw surgery in selected patients or the use of an oral appliance in certain patients. ENT evaluation and/or consultation with a maxillofacial surgeon or dentist may be feasible in some instances.    2. Please note that untreated obstructive sleep apnea may carry additional perioperative morbidity. Patients with significant obstructive sleep apnea should receive perioperative PAP therapy and the surgeons and particularly the anesthesiologist should be informed of the diagnosis and the severity of the sleep disordered breathing. 3. This study shows significant sleep fragmentation and abnormal sleep stage percentages; these are nonspecific findings and per se do not signify an intrinsic sleep disorder or a cause for the patient's sleep-related symptoms. Causes include (but are not limited to) the first night effect of the sleep study, circadian rhythm disturbances, medication effect or an underlying mood disorder or medical problem.  4. The patient should be cautioned not to drive, work at heights, or operate dangerous or heavy equipment when tired or sleepy. Review and reiteration of good sleep hygiene measures should be pursued with any patient. 5. The patient will be seen in follow-up in the sleep clinic at [facility] for discussion of the test results, symptom and treatment compliance review, further management strategies, etc. The referring provider will be notified of the test results.  I certify that I have reviewed the entire raw data recording prior to the issuance of this report in accordance with the Standards of  Accreditation of the American Academy of Sleep Medicine (AASM)  Star Age, MD, PhD Diplomat, American Board of Neurology and Sleep Medicine (Neurology and Sleep Medicine)

## 2020-10-02 NOTE — Progress Notes (Signed)
Patient referred by Tereasa Coop, PA, seen by me on 08/28/20, patient had a NPSG on 09/16/20.    Please call and notify the patient that the recent sleep study showed severe sleep apnea. She had difficulty initiating and maintaining sleep. She did not sleep on her back and did not achieve REM/dream sleep. I recommend we start treatment at home in the form of autoPAP, which means, that we don't have to bring her in for a sleep study with CPAP, but will let her start using a so called autoPAP machine at home, through a DME company (of her choice, or as per insurance requirement). The DME representative will fit the patient with a mask of choice, educate her on how to use the machine, how to put the mask on, etc. I have placed an order in the chart. Please send the order to a local DME, talk to patient, send report to referring MD. Please also reinforce the need for compliance with treatment. We will need a FU in sleep clinic for 10 weeks post-PAP set up, please arrange that with me or one of our NPs. Thanks,   Star Age, MD, PhD [facility])

## 2020-10-03 ENCOUNTER — Telehealth: Payer: Self-pay

## 2020-10-03 NOTE — Telephone Encounter (Signed)
I attempted to reach the pt and discuss results. Phone disconnected at the time of the call will try again later.

## 2020-10-03 NOTE — Telephone Encounter (Signed)
-----   Message from Star Age, MD sent at 10/02/2020  5:59 PM EDT ----- Patient referred by Tereasa Coop, PA, seen by me on 08/28/20, patient had a NPSG on 09/16/20.    Please call and notify the patient that the recent sleep study showed severe sleep apnea. She had difficulty initiating and maintaining sleep. She did not sleep on her back and did not achieve REM/dream sleep. I recommend we start treatment at home in the form of autoPAP, which means, that we don't have to bring her in for a sleep study with CPAP, but will let her start using a so called autoPAP machine at home, through a DME company (of her choice, or as per insurance requirement). The DME representative will fit the patient with a mask of choice, educate her on how to use the machine, how to put the mask on, etc. I have placed an order in the chart. Please send the order to a local DME, talk to patient, send report to referring MD. Please also reinforce the need for compliance with treatment. We will need a FU in sleep clinic for 10 weeks post-PAP set up, please arrange that with me or one of our NPs. Thanks,   Star Age, MD, PhD [facility])

## 2020-10-09 NOTE — Telephone Encounter (Signed)
I called pt. No answer, left a message asking pt to call me back.   

## 2020-10-10 NOTE — Telephone Encounter (Signed)
Pt has called Megan,RN back.

## 2020-10-10 NOTE — Telephone Encounter (Signed)
I called Sheila Yoder. I advised Sheila Yoder that Dr. Rexene Alberts reviewed their sleep study results and found that Sheila Yoder has severe. Dr. OSA recommends that Sheila Yoder start an autopap for treatment . I reviewed PAP compliance expectations with the Sheila Yoder. Sheila Yoder is agreeable to starting an auto-PAP. I advised Sheila Yoder that an order will be sent to a DME, [facility], and [facility] will call the Sheila Yoder within about one week after they file with the Sheila Yoder's insurance. [facility] will show the Sheila Yoder how to use the machine, fit for masks, and troubleshoot the auto-PAP if needed. A follow up appt was made for insurance purposes with Dr. Rexene Alberts on 12/27/20. Sheila Yoder verbalized understanding to arrive 15 minutes early and bring their auto-PAP. A letter with all of this information in it will be mailed to the Sheila Yoder as a reminder. I verified with the Sheila Yoder that the address we have on file is correct. Sheila Yoder verbalized understanding of results. Sheila Yoder had no questions at this time but was encouraged to call back if questions arise. I have sent the order to [facility] and have received confirmation that they have received the order.

## 2020-10-11 ENCOUNTER — Ambulatory Visit: Payer: Medicare Other | Admitting: Physician Assistant

## 2020-10-11 ENCOUNTER — Telehealth: Payer: Self-pay

## 2020-10-11 NOTE — Telephone Encounter (Signed)
This pt has called the sleep lab and spoke with Rockne Menghini. Pt is asking for a return call back.

## 2020-10-11 NOTE — Telephone Encounter (Signed)
I called the pt back. She sts she did not have any questions at this time, reports she is waiting on call from [facility] about starting auto-pap. Pt verbalized application for the call.

## 2020-11-15 IMAGING — US US BREAST*L* LIMITED INC AXILLA
1 series · 7 of 7 positions shown · non-contrast
Comparison: None.

CLINICAL DATA: Patient recalled from screening for left breast
mass.

EXAM:
DIGITAL DIAGNOSTIC LEFT MAMMOGRAM WITH CAD AND TOMO
ULTRASOUND LEFT BREAST

[Series 1: us breast*left* limited inc axilla · 0.07mm/px · 7 of 7 slices shown]
[im 1/7]
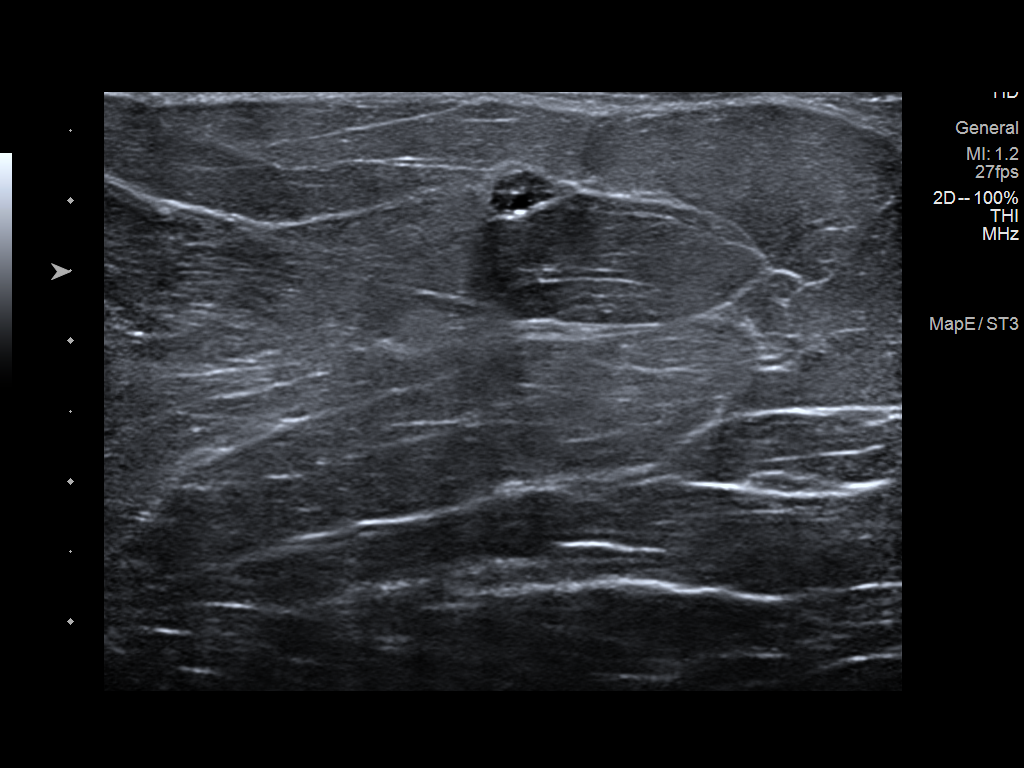
[im 2/7]
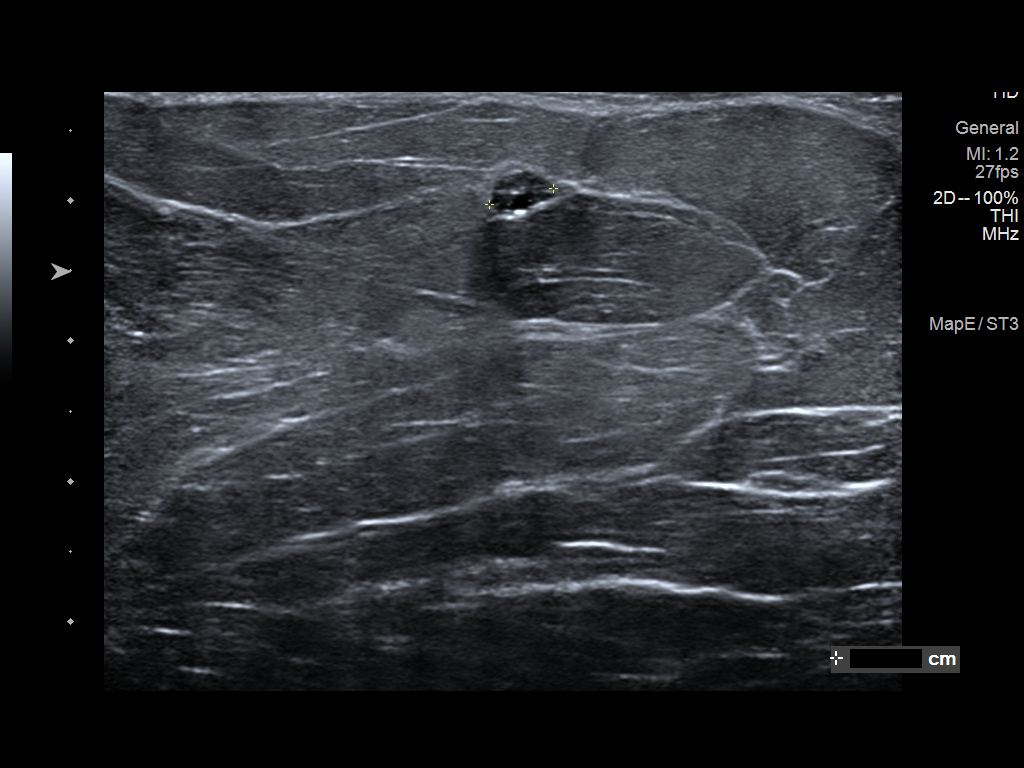
[im 3/7]
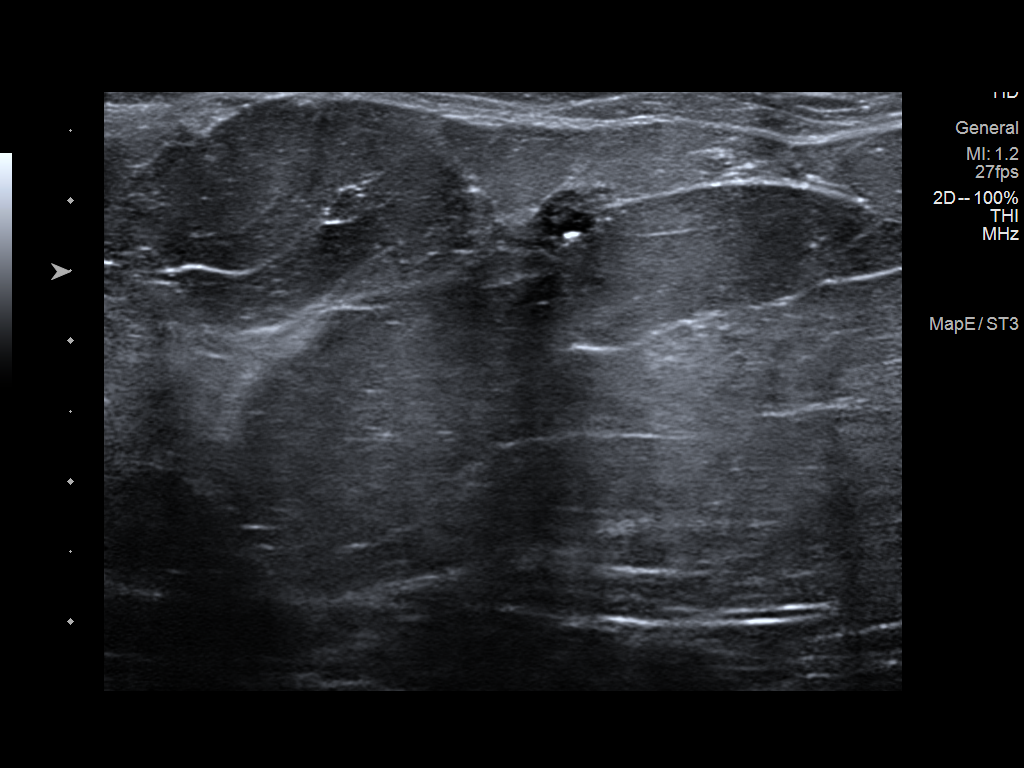
[im 4/7]
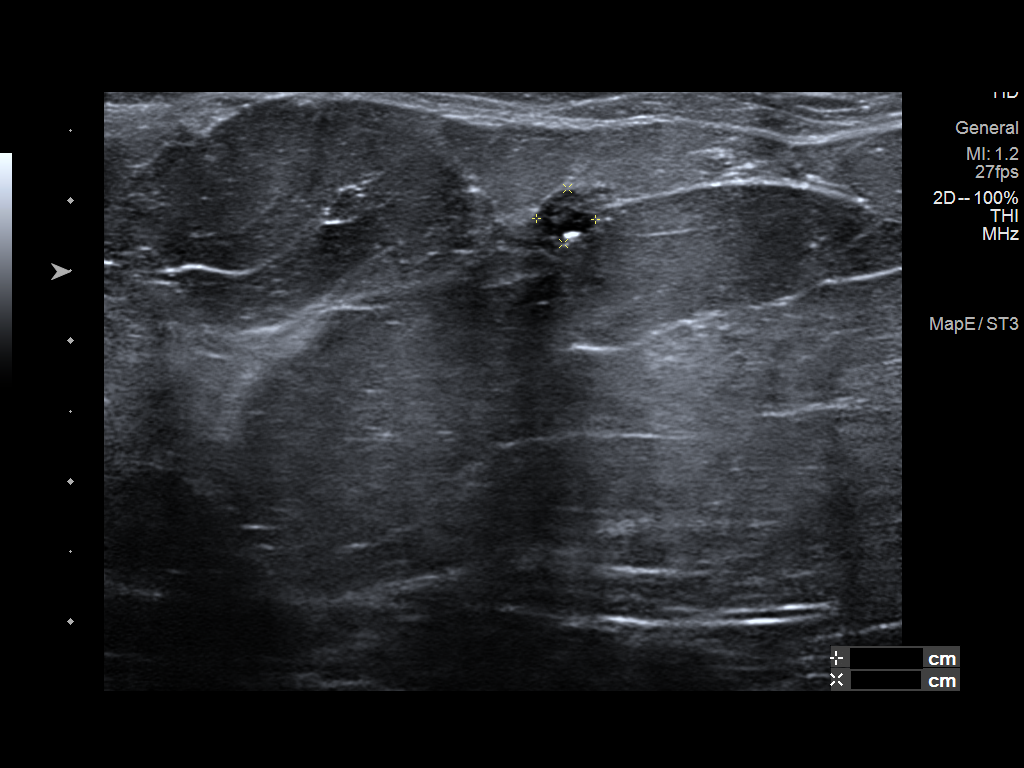
[im 5/7]
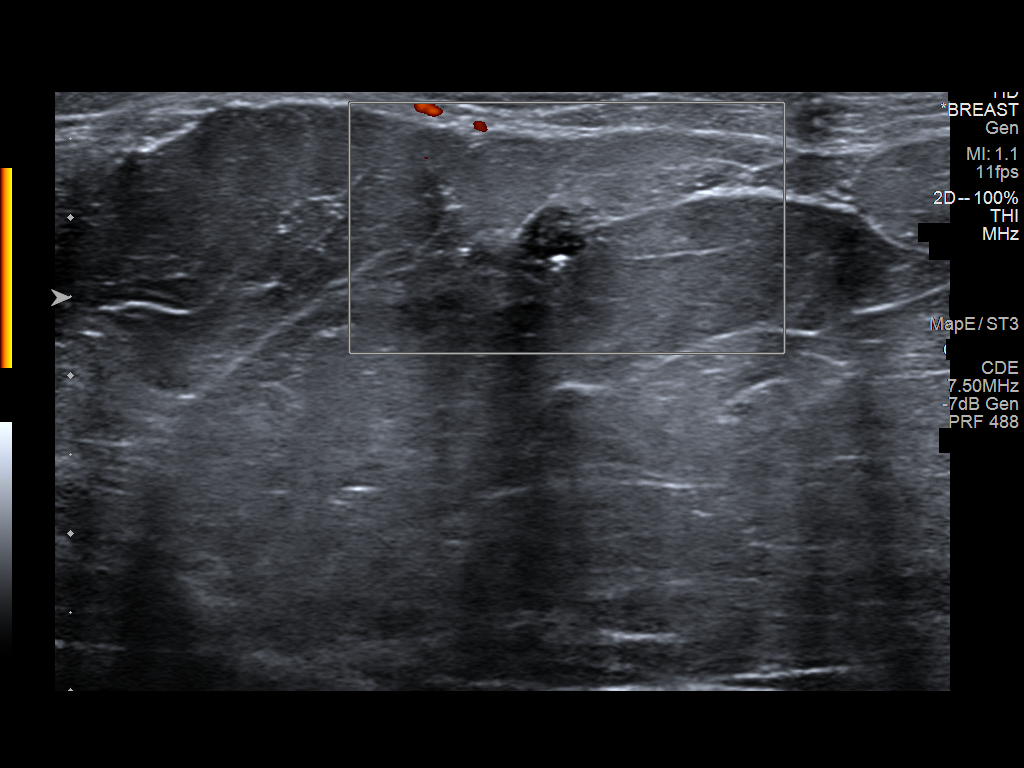
[im 6/7]
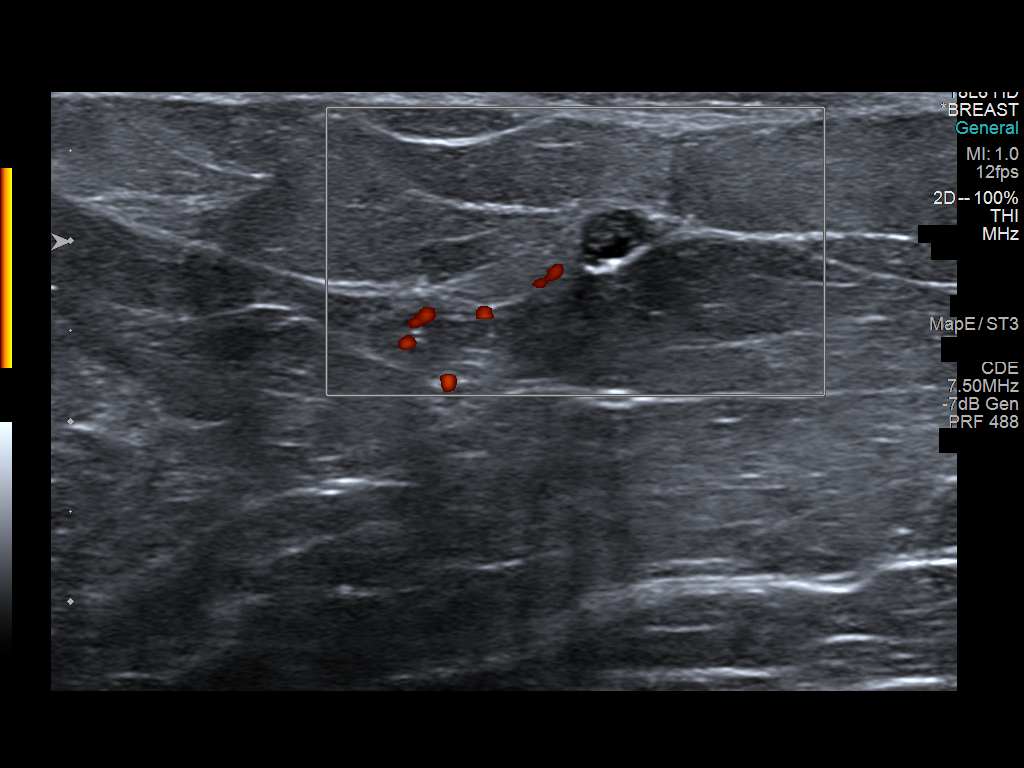
[im 7/7]
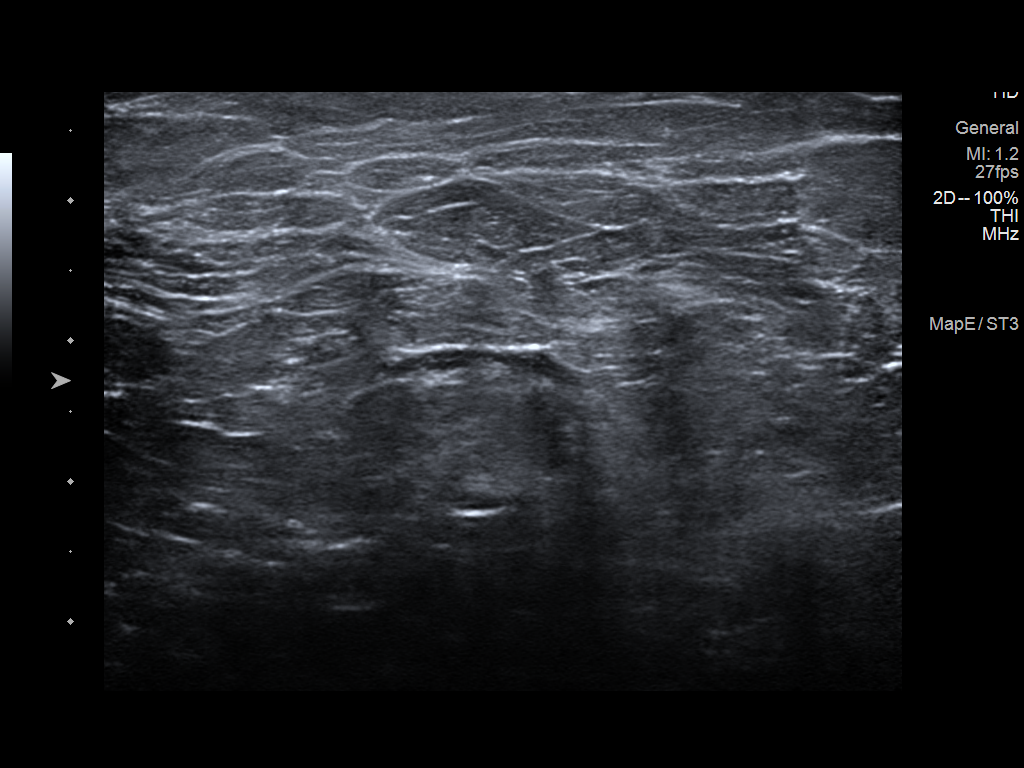

[7 of 7 positions shown; findings below may reference images not displayed]

ACR Breast Density Category b: There are scattered areas of
fibroglandular density.
FINDINGS: There is a persistent lobular mass within the 6 o'clock position
left breast anterior depth, further evaluated with spot compression
CC and MLO tomosynthesis images.

Mammographic images were processed with CAD.

Targeted ultrasound is performed, showing a 5 x 4 x 3 mm solid and
cystic mass left breast 5:30 o'clock 3 cm from nipple.

No left axillary adenopathy.
IMPRESSION: Indeterminate left breast mass.

RECOMMENDATION:
Ultrasound-guided core needle biopsy indeterminate left breast mass.

I have discussed the findings and recommendations with the patient.
If applicable, a reminder letter will be sent to the patient
regarding the next appointment.

BI-RADS CATEGORY  4: Suspicious.

## 2020-11-19 IMAGING — MG MM BREAST LOCALIZATION CLIP
6 series · 6 of 18 positions shown · non-contrast
Comparison: Previous exam(s).

CLINICAL DATA: Post biopsy mammogram of the left breast for clip
placement.

EXAM:
DIAGNOSTIC LEFT MAMMOGRAM POST ULTRASOUND BIOPSY

[L CC synth-2D (1 of 2)]
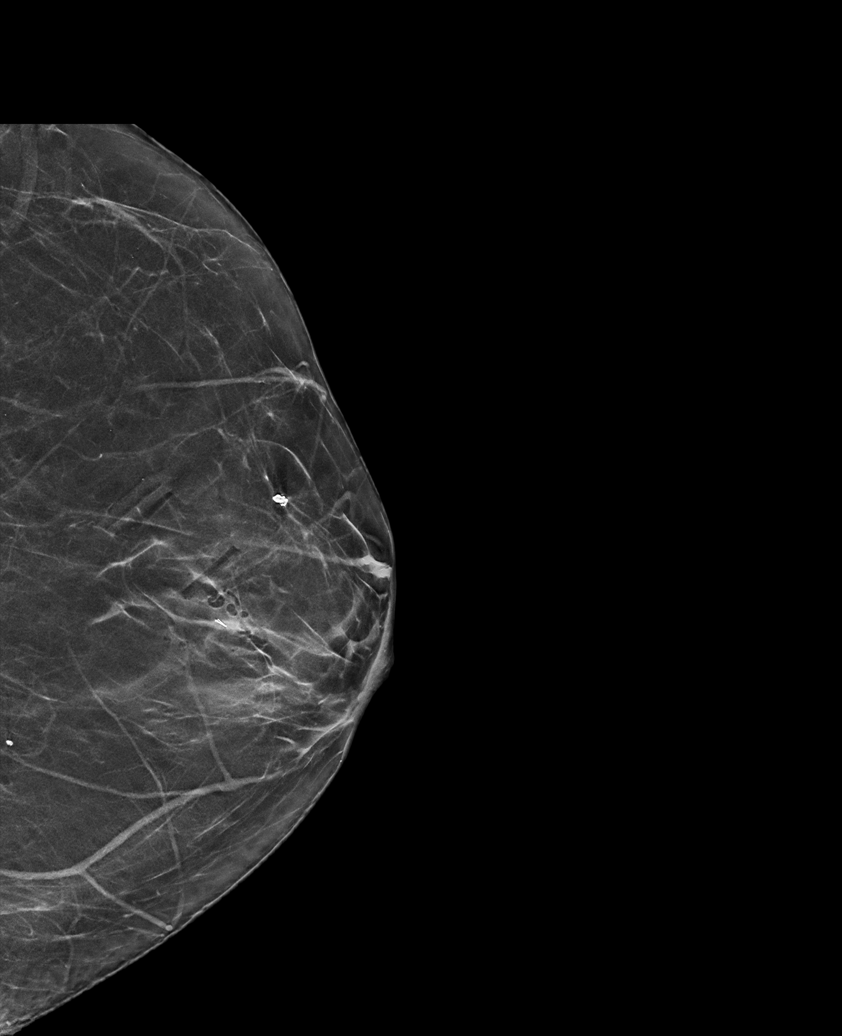

[L CC synth-2D (2 of 2)]
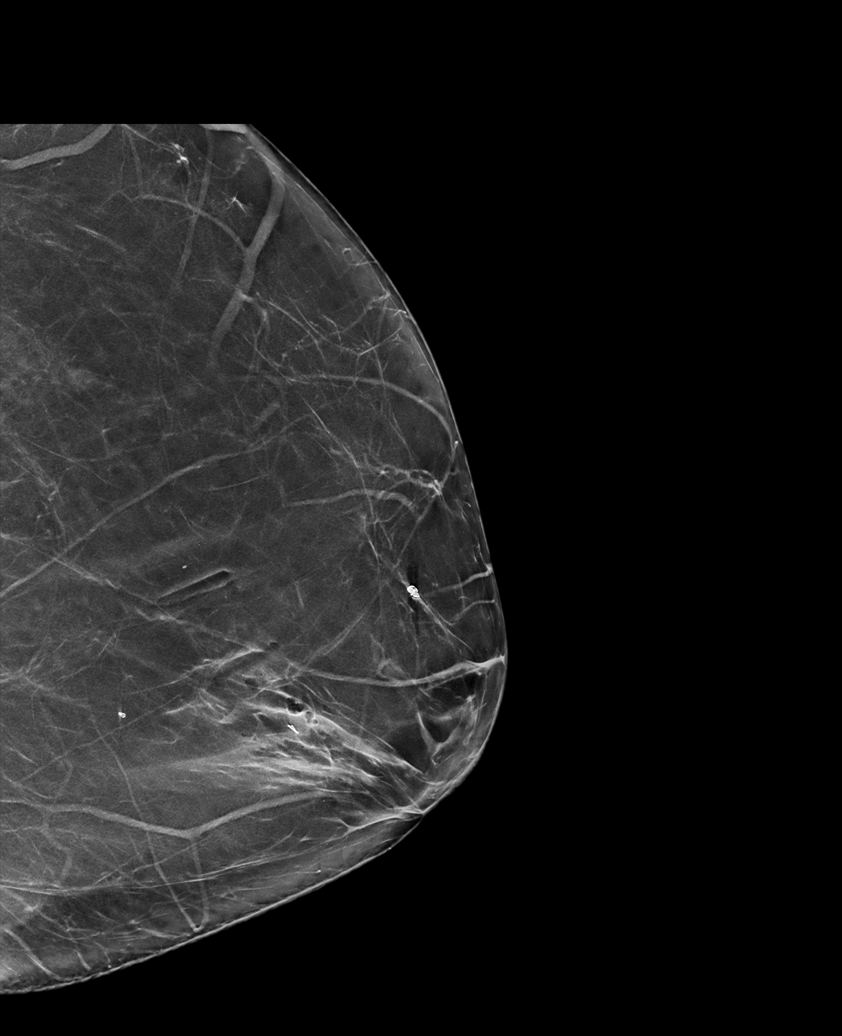

[L ML synth-2D]
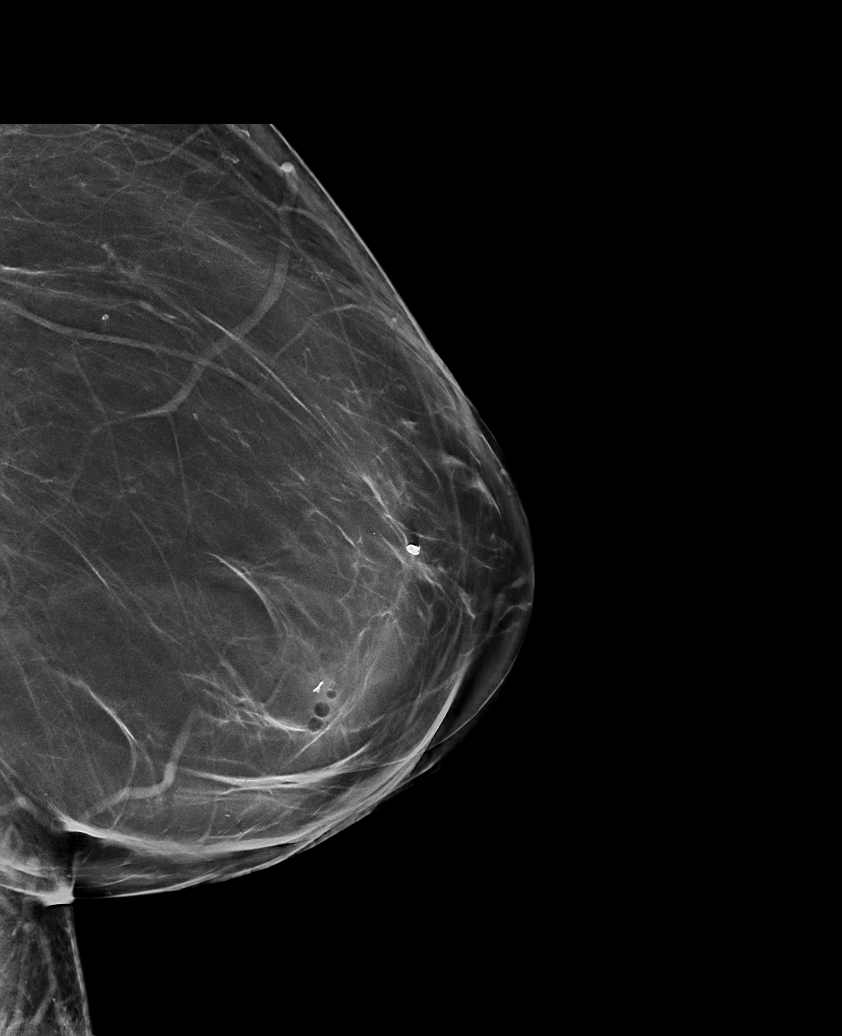

[L CC tomo (1 of 2) · tomo slice 38/75.0]
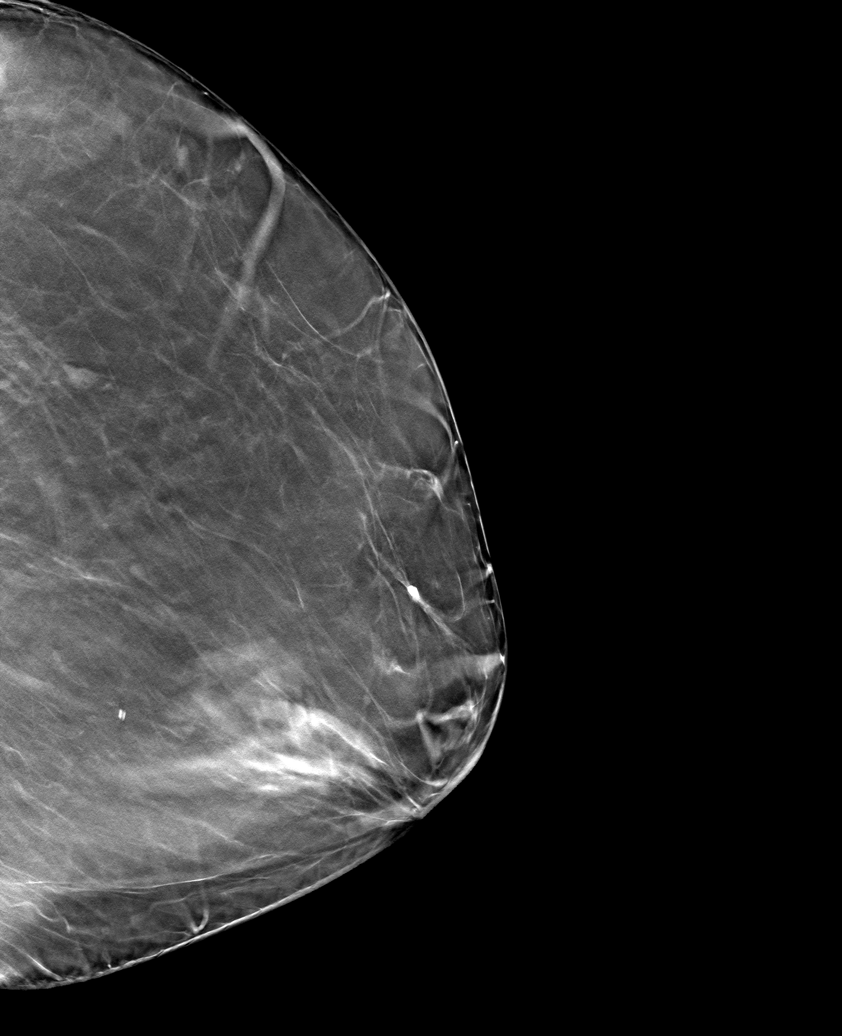

[L ML tomo · tomo slice 41/81.0]
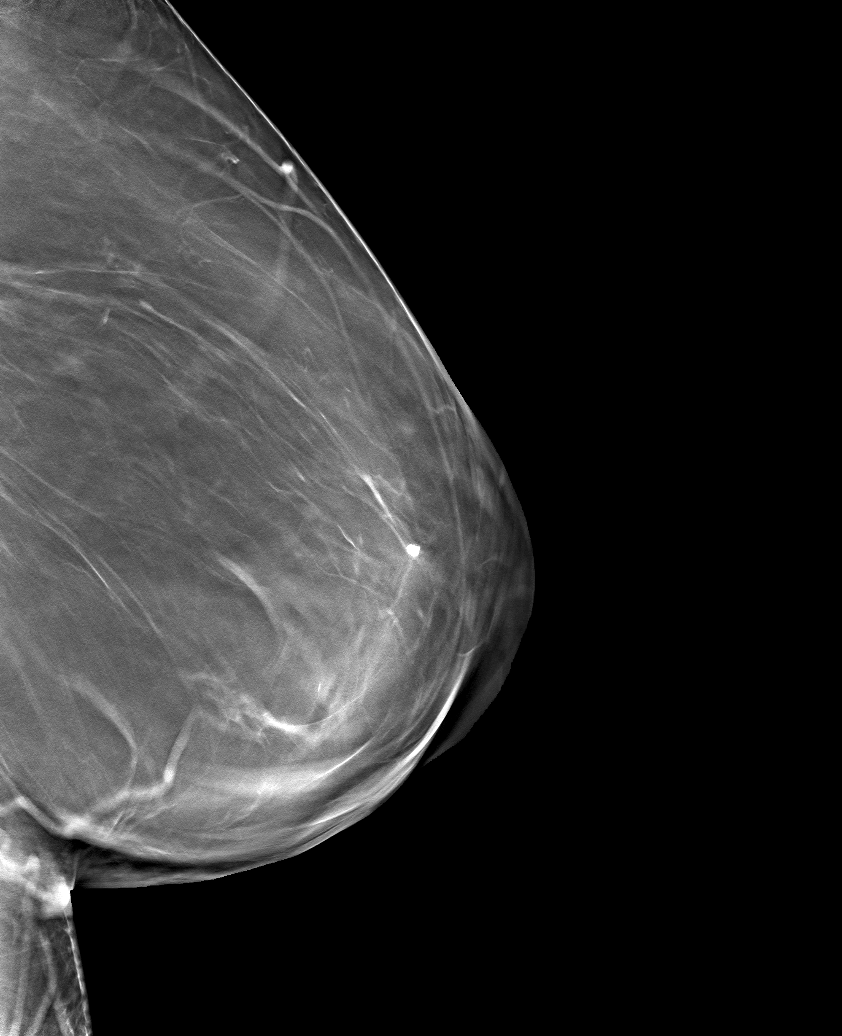

[L CC tomo (2 of 2) · tomo slice 32/63.0]
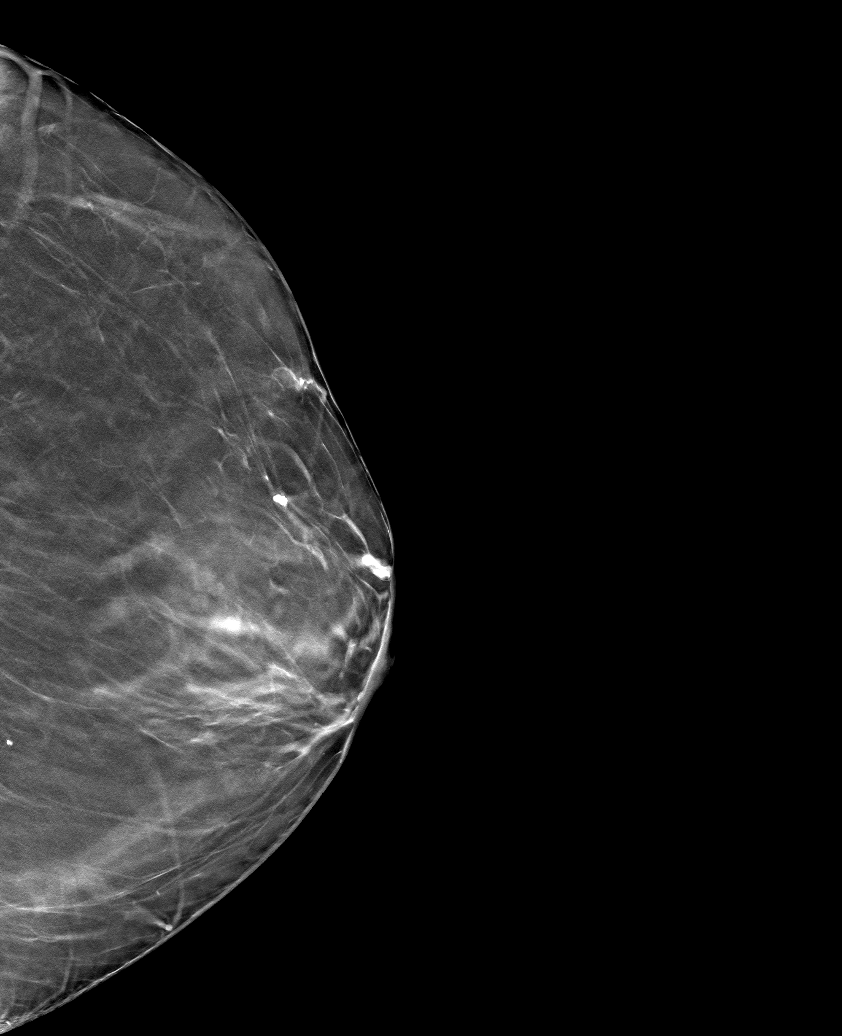

[6 of 18 positions shown; findings below may reference images not displayed]

FINDINGS: Mammographic images were obtained following ultrasound guided biopsy
of a mass in the left breast at [DATE]. The biopsy marking clip is in
expected position at the site of biopsy.
IMPRESSION: Appropriate positioning of the ribbon shaped biopsy marking clip at
the site of biopsy in the inferior left breast.

Final Assessment: Post Procedure Mammograms for Marker Placement

## 2020-11-23 ENCOUNTER — Telehealth: Payer: Self-pay | Admitting: Neurology

## 2020-11-23 DIAGNOSIS — G4733 Obstructive sleep apnea (adult) (pediatric): Secondary | ICD-10-CM

## 2020-11-23 DIAGNOSIS — Z789 Other specified health status: Secondary | ICD-10-CM

## 2020-11-23 NOTE — Telephone Encounter (Signed)
Pt states she has been using her cpap for 2 nights now and the pressure is way too high, she has difficulty breathing at this setting.  Pt asking On Call Dr be reached out too so she can have some kind of relief and still be able to use her CPAP, please call.

## 2020-11-23 NOTE — Addendum Note (Signed)
Addended by: Star Age on: 11/23/2020 11:14 AM   Modules accepted: Orders

## 2020-11-23 NOTE — Telephone Encounter (Signed)
Pt was called and the message from Dr Jaynee Eagles was relayed to pt.  Pt will wait for a response from Dr Rexene Alberts on Tues of next week.

## 2020-11-23 NOTE — Telephone Encounter (Signed)
I ordered a reduced pressure on her autoPAP machine.

## 2020-11-23 NOTE — Telephone Encounter (Signed)
Sheila Yoder, let patient know I will have to forward to Dr. Rexene Alberts and she will have to address this when he is back in the office, thanks

## 2020-11-27 NOTE — Telephone Encounter (Signed)
I called the patient and advised Dr. Rexene Alberts has changed the autopap pressure from 7-14 to 4-11. [facility] has been notified of the change and pt advised to CB once the pressure has been adjusted and let us know how she is doing,  Pt was agreeable.

## 2020-12-27 ENCOUNTER — Encounter: Payer: Self-pay | Admitting: Neurology

## 2020-12-27 ENCOUNTER — Telehealth: Payer: Self-pay

## 2020-12-27 ENCOUNTER — Ambulatory Visit: Payer: Self-pay | Admitting: Neurology

## 2020-12-27 NOTE — Telephone Encounter (Signed)
Pt no showed 12/27/20 appt with Dr. Rexene Alberts.

## 2021-01-09 ENCOUNTER — Ambulatory Visit: Payer: Medicare Other | Admitting: Physician Assistant

## 2021-01-09 NOTE — Progress Notes (Deleted)
Patient ID: Sheila Yoder, female   DOB: 1966-06-06, 55 y.o.   MRN: [id number]   Seen at [facility] 01/07/2021 and diagnosed with RML CAP.  Tested neg for Covid and Influenza.  From [facility] A/P: Impression/Plan 1. Community acquired pneumonia of right middle lobe of lung - levalbuterol (XOPENEX HFA) 45 mcg/actuation inhaler; Inhale 1-2 puffs into the lungs every 4 (four) hours as needed for Wheezing. Dispense: 15 g; Refill: 0 - doxycycline hyclate (VIBRAMYCIN) 100 MG capsule; Take 1 capsule (100 mg total) by mouth 2 times daily. Dispense: 20 capsule; Refill: 0 - codeine-guaifenesin (CHERATUSSIN AC) 10-100 mg/5 mL syrup; 1-2 tsp po q4-6 hours prn cough mainly at bedtime. Do not take and drive or operate heavy machinery Dispense: 118 mL; Refill: 0  2. Sore throat - POCT COVID BD - POCT INFLUENZA A/B  3. Cough  - XR CHEST PA AND LATERAL   1. Patient has been instructed on RX/ OTC medications, dosages, side effects, and possible interactions as associated with each diagnosis in my impression and plan above.  2. Patient education (verbal/handout) given on diagnosis, pathophysiology, treatment of diagnosis, side effects of medication use for treatment, restrictions while taking medication, supportive measures such as supportive care, pushing fluids, BRAT diet, fluid rehydration protocol if needed. Red Flags associated with diagnosis/es were reviewed and patient instructed on action plan if red flags develop.  3. Patient was instructed on follow up care:  Discharged to [place] with Follow up with PCP in 2-5 days

## 2021-01-30 DIAGNOSIS — Z0289 Encounter for other administrative examinations: Secondary | ICD-10-CM

## 2021-02-11 ENCOUNTER — Ambulatory Visit: Payer: Medicare Other | Admitting: Podiatry

## 2021-02-14 ENCOUNTER — Ambulatory Visit: Payer: Medicare Other | Admitting: Sports Medicine

## 2021-02-25 ENCOUNTER — Encounter: Payer: Self-pay | Admitting: Podiatrist

## 2021-02-25 ENCOUNTER — Ambulatory Visit (INDEPENDENT_AMBULATORY_CARE_PROVIDER_SITE_OTHER): Payer: Medicare Other

## 2021-02-25 ENCOUNTER — Other Ambulatory Visit: Payer: Self-pay

## 2021-02-25 ENCOUNTER — Ambulatory Visit (INDEPENDENT_AMBULATORY_CARE_PROVIDER_SITE_OTHER): Payer: Medicare Other | Admitting: Podiatrist

## 2021-02-25 DIAGNOSIS — M722 Plantar fascial fibromatosis: Secondary | ICD-10-CM

## 2021-02-25 DIAGNOSIS — M7662 Achilles tendinitis, left leg: Secondary | ICD-10-CM | POA: Diagnosis not present

## 2021-02-25 NOTE — Patient Instructions (Addendum)
Voltaren Gel (diclofenac gel) is a topical antiiflammatory gel that you can apply 2-4 times a day on the back of the heel for pain relief.     Achilles Tendinitis  with Rehab Achilles tendinitis is a disorder of the Achilles tendon. The Achilles tendon connects the large calf muscles (Gastrocnemius and Soleus) to the heel bone (calcaneus). This tendon is sometimes called the heel cord. It is important for pushing-off and standing on your toes and is important for walking, running, or jumping. Tendinitis is often caused by overuse and repetitive microtrauma. SYMPTOMS Pain, tenderness, swelling, warmth, and redness may occur over the Achilles tendon even at rest. Pain with pushing off, or flexing or extending the ankle. Pain that is worsened after or during activity. CAUSES  Overuse sometimes seen with rapid increase in exercise programs or in sports requiring running and jumping. Poor physical conditioning (strength and flexibility or endurance). Running sports, especially training running down hills. Inadequate warm-up before practice or play or failure to stretch before participation. Injury to the tendon. PREVENTION  Warm up and stretch before practice or competition. Allow time for adequate rest and recovery between practices and competition. Keep up conditioning. Keep up ankle and leg flexibility. Improve or keep muscle strength and endurance. Improve cardiovascular fitness. Use proper technique. Use proper equipment (shoes, skates). To help prevent recurrence, taping, protective strapping, or an adhesive bandage may be recommended for several weeks after healing is complete. PROGNOSIS  Recovery may take weeks to several months to heal. Longer recovery is expected if symptoms have been prolonged. Recovery is usually quicker if the inflammation is due to a direct blow as compared with overuse or sudden strain. RELATED COMPLICATIONS  Healing time will be prolonged if the condition  is not correctly treated. The injury must be given plenty of time to heal. Symptoms can reoccur if activity is resumed too soon. Untreated, tendinitis may increase the risk of tendon rupture requiring additional time for recovery and possibly surgery. TREATMENT  The first treatment consists of rest anti-inflammatory medication, and ice to relieve the pain. Stretching and strengthening exercises after resolution of pain will likely help reduce the risk of recurrence. Referral to a physical therapist or athletic trainer for further evaluation and treatment may be helpful. A walking boot or cast may be recommended to rest the Achilles tendon. This can help break the cycle of inflammation and microtrauma. Arch supports (orthotics) may be prescribed or recommended by your caregiver as an adjunct to therapy and rest. Surgery to remove the inflamed tendon lining or degenerated tendon tissue is rarely necessary and has shown less than predictable results. MEDICATION  Nonsteroidal anti-inflammatory medications, such as aspirin and ibuprofen, may be used for pain and inflammation relief. Do not take within 7 days before surgery. Take these as directed by your caregiver. Contact your caregiver immediately if any bleeding, stomach upset, or signs of allergic reaction occur. Other minor pain relievers, such as acetaminophen, may also be used. Pain relievers may be prescribed as necessary by your caregiver. Do not take prescription pain medication for longer than 4 to 7 days. Use only as directed and only as much as you need. HEAT AND COLD Cold is used to relieve pain and reduce inflammation for acute and chronic Achilles tendinitis. Cold should be applied for 10 to 15 minutes every 2 to 3 hours for inflammation and pain and immediately after any activity that aggravates your symptoms. Use ice packs or an ice massage. Heat may be used before performing  stretching and strengthening activities prescribed by your  caregiver. Use a heat pack or a warm soak. SEEK MEDICAL CARE IF: Symptoms get worse or do not improve in 2 weeks despite treatment. New, unexplained symptoms develop. Drugs used in treatment may produce side effects.   EXERCISES-- hold each stretch for 30 seconds and repeat 10 times.  Complete each stretch 3 times per day.   RANGE OF MOTION (ROM) AND STRETCHING EXERCISES - Achilles Tendinitis  These exercises may help you when beginning to rehabilitate your injury. Your symptoms may resolve with or without further involvement from your physician, physical therapist or athletic trainer. While completing these exercises, remember:  Restoring tissue flexibility helps normal motion to return to the joints. This allows healthier, less painful movement and activity. An effective stretch should be held for at least 30 seconds. A stretch should never be painful. You should only feel a gentle lengthening or release in the stretched tissue. STRETCH  Gastroc, Standing  Place hands on wall. Extend right / left leg, keeping the front knee somewhat bent. Slightly point your toes inward on your back foot. Keeping your right / left heel on the floor and your knee straight, shift your weight toward the wall, not allowing your back to arch. You should feel a gentle stretch in the right / left calf. Hold this position for __________ seconds. Repeat __________ times. Complete this stretch __________ times per day. STRETCH  Soleus, Standing  Place hands on wall. Extend right / left leg, keeping the other knee somewhat bent. Slightly point your toes inward on your back foot. Keep your right / left heel on the floor, bend your back knee, and slightly shift your weight over the back leg so that you feel a gentle stretch deep in your back calf. Hold this position for __________ seconds. Repeat __________ times. Complete this stretch __________ times per day. STRETCH  Gastrocsoleus, Standing  Note: This exercise  can place a lot of stress on your foot and ankle. Please complete this exercise only if specifically instructed by your caregiver.  Place the ball of your right / left foot on a step, keeping your other foot firmly on the same step. Hold on to the wall or a rail for balance. Slowly lift your other foot, allowing your body weight to press your heel down over the edge of the step. You should feel a stretch in your right / left calf. Hold this position for __________ seconds. Repeat this exercise with a slight bend in your knee. Repeat __________ times. Complete this stretch __________ times per day.  STRENGTHENING EXERCISES - Achilles Tendinitis These exercises may help you when beginning to rehabilitate your injury. They may resolve your symptoms with or without further involvement from your physician, physical therapist or athletic trainer. While completing these exercises, remember:  Muscles can gain both the endurance and the strength needed for everyday activities through controlled exercises. Complete these exercises as instructed by your physician, physical therapist or athletic trainer. Progress the resistance and repetitions only as guided. You may experience muscle soreness or fatigue, but the pain or discomfort you are trying to eliminate should never worsen during these exercises. If this pain does worsen, stop and make certain you are following the directions exactly. If the pain is still present after adjustments, discontinue the exercise until you can discuss the trouble with your clinician. STRENGTH - Plantar-flexors  Sit with your right / left leg extended. Holding onto both ends of a rubber exercise band/tubing,  loop it around the ball of your foot. Keep a slight tension in the band. Slowly push your toes away from you, pointing them downward. Hold this position for __________ seconds. Return slowly, controlling the tension in the band/tubing. Repeat __________ times. Complete this  exercise __________ times per day.  STRENGTH - Plantar-flexors  Stand with your feet shoulder width apart. Steady yourself with a wall or table using as little support as needed. Keeping your weight evenly spread over the width of your feet, rise up on your toes.* Hold this position for __________ seconds. Repeat __________ times. Complete this exercise __________ times per day.  *If this is too easy, shift your weight toward your right / left leg until you feel challenged. Ultimately, you may be asked to do this exercise with your right / left foot only. STRENGTH  Plantar-flexors, Eccentric  Note: This exercise can place a lot of stress on your foot and ankle. Please complete this exercise only if specifically instructed by your caregiver.  Place the balls of your feet on a step. With your hands, use only enough support from a wall or rail to keep your balance. Keep your knees straight and rise up on your toes. Slowly shift your weight entirely to your right / left toes and pick up your opposite foot. Gently and with controlled movement, lower your weight through your right / left foot so that your heel drops below the level of the step. You will feel a slight stretch in the back of your calf at the end position. Use the healthy leg to help rise up onto the balls of both feet, then lower weight only on the right / left leg again. Build up to 15 repetitions. Then progress to 3 consecutive sets of 15 repetitions.* After completing the above exercise, complete the same exercise with a slight knee bend (about 30 degrees). Again, build up to 15 repetitions. Then progress to 3 consecutive sets of 15 repetitions.* Perform this exercise __________ times per day.  *When you easily complete 3 sets of 15, your physician, physical therapist or athletic trainer may advise you to add resistance by wearing a backpack filled with additional weight. STRENGTH - Plantar Flexors, Seated  Sit on a chair that allows  your feet to rest flat on the ground. If necessary, sit at the edge of the chair. Keeping your toes firmly on the ground, lift your right / left heel as far as you can without increasing any discomfort in your ankle. Repeat __________ times. Complete this exercise __________ times a day. *If instructed by your physician, physical therapist or athletic trainer, you may add ____________________ of resistance by placing a weighted object on your right / left knee. Document Released: 01/15/2005 Document Revised: 09/08/2011 Document Reviewed: 09/28/2008 [facility] Patient Information 2014 [place], [place].

## 2021-02-25 NOTE — Progress Notes (Signed)
Chief Complaint  Patient presents with   Pain    Lt bottom heel pain radiates to back heel x couple wks no injury - 8/10 sharp constant pain -w/ swelling - worse withcarrying stuff and first step in AM Tx: stretchign      HPI: Patient is 55 y.o. female who presents today for the concerns as listed above.   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   Non-seasonal allergic rhinitis 05/11/2020   Prediabetes 05/02/2020   Essential hypertension 05/01/2020   Class 3 severe obesity with serious comorbidity and body mass index (BMI) of 50.0 to 59.9 in adult [facility]) 03/09/2020   Radial scar of left breast 03/09/2020   Nasal obstruction 02/02/2020   Deviated nasal septum 01/12/2020   History of colon polyps 01/12/2020   Laryngopharyngeal reflux (LPR) 10/11/2019   Colon cancer screening    Benign neoplasm of cecum    Benign neoplasm of ascending colon    Diarrhea    Dysphagia    Esophageal stricture    Gastroesophageal reflux disease    Plantar fasciitis of right foot 05/30/2019   Gastroesophageal reflux disease without esophagitis 07/15/2017   Peri-menopause 08/23/2014   Screening for STD (sexually transmitted disease) 08/23/2014    Current Outpatient Medications on File Prior to Visit  Medication Sig Dispense Refill   amLODipine (NORVASC) 5 MG tablet Take by mouth.     escitalopram (LEXAPRO) 10 MG tablet      fluticasone (FLONASE) 50 MCG/ACT nasal spray Place into the nose.     levalbuterol (XOPENEX HFA) 45 MCG/ACT inhaler SMARTSIG:1-2 Puff(s) Via Inhaler Every 4 Hours PRN     acetaminophen (TYLENOL) 500 MG tablet Take 1,000 mg by mouth 2 (two) times daily as needed for headache.     amLODipine (NORVASC) 10 MG tablet Take 10 mg by mouth daily.     amLODipine (NORVASC) 5 MG tablet TAKE 2 TABLETS (10 MG TOTAL) BY MOUTH DAILY. 180 tablet 1   Ascorbic Acid (VITAMIN C WITH ROSE HIPS) 500 MG tablet Take 1,500 mg by mouth  daily.     ascorbic acid (VITAMIN C) 1000 MG tablet Take by mouth.     atorvastatin (LIPITOR) 40 MG tablet Take 40 mg by mouth daily. (Patient not taking: Reported on 08/28/2020)     atorvastatin (LIPITOR) 40 MG tablet TAKE 1 TABLET (40 MG TOTAL) BY MOUTH DAILY. 90 tablet 3   Cholecalciferol (DIALYVITE VITAMIN D 5000 PO) Take 5,000 Units by mouth daily.     Ferrous Sulfate (IRON) 325 (65 Fe) MG TABS Take 325 mg by mouth daily.  (Patient not taking: Reported on 08/28/2020)     Fexofenadine HCl (MUCINEX ALLERGY PO) Take 1 tablet by mouth daily as needed (allergy).  (Patient not taking: Reported on 08/28/2020)     fluticasone (FLONASE) 50 MCG/ACT nasal spray Place 2 sprays into both nostrils daily. 16 g 6   fluticasone (FLONASE) 50 MCG/ACT nasal spray PLACE 2 SPRAYS INTO BOTH NOSTRILS DAILY. 16 g 6   Garlic [id number] MG CAPS Take 1,000 mg by mouth daily.     lansoprazole (PREVACID) 30 MG capsule Take 30 mg by mouth daily at 12 noon.     lansoprazole (PREVACID) 30 MG capsule TAKE 1 CAPSULE (30 MG TOTAL) BY MOUTH 2 (TWO) TIMES DAILY BEFORE A MEAL. 180 capsule 1   omeprazole (PRILOSEC) 40 MG capsule TAKE 1 CAPSULE (40 MG TOTAL) BY MOUTH DAILY. 30 capsule  3   oxyCODONE (ROXICODONE) 5 MG immediate release tablet Take 1 tablet (5 mg total) by mouth every 4 (four) hours as needed for severe pain. (Patient not taking: Reported on 08/28/2020) 12 tablet 0   pantoprazole (PROTONIX) 40 MG tablet Take 40 mg by mouth daily.     Probiotic Product (PROBIOTIC PO) Take 1 capsule by mouth daily.     prochlorperazine (COMPAZINE) 10 MG tablet Take 1 tablet (10 mg total) by mouth every 6 (six) hours as needed for nausea or vomiting. (Patient not taking: Reported on 08/28/2020) 20 tablet 0   Zinc 50 MG TABS Take 50 mg by mouth daily.     [DISCONTINUED] buPROPion (WELLBUTRIN XL) 150 MG 24 hr tablet Take 1 tablet (150 mg total) by mouth daily. 90 tablet 0   [DISCONTINUED] esomeprazole (NEXIUM) 40 MG capsule Take 1 capsule (40 mg total) by  mouth daily at 12 noon. 90 capsule 0   [DISCONTINUED] misoprostol (CYTOTEC) 200 MCG tablet Take 3 pills by mouth the night before biopsy. (Patient not taking: Reported on 11/02/2019) 3 tablet 0   [DISCONTINUED] pravastatin (PRAVACHOL) 40 MG tablet Take 1 tablet (40 mg total) by mouth daily. (Patient not taking: Reported on 04/25/2020) 90 tablet 3   No current facility-administered medications on file prior to visit.    Allergies  Allergen Reactions   Atorvastatin Other (See Comments)    myalgia myalgia    Latex Rash   Rosuvastatin Hives   Bactrim [Sulfamethoxazole-Trimethoprim] Other (See Comments)    Chest pain   Other Other (See Comments)    Sneezing    Review of Systems No fevers, chills, nausea, muscle aches, no difficulty breathing, no calf pain, no chest pain or shortness of breath.   Physical Exam  GENERAL APPEARANCE: Alert, conversant. Appropriately groomed. No acute distress.   VASCULAR: Pedal pulses palpable DP and PT bilateral.  Capillary refill time is immediate to all digits,  Proximal to distal cooling it warm to warm.  Digital perfusion adequate.   NEUROLOGIC: sensation is intact to 5.07 monofilament at 5/5 sites bilateral.  Light touch is intact bilateral, vibratory sensation intact bilateral  MUSCULOSKELETAL: acceptable muscle strength, tone and stability bilateral.  No pain along the plantar fascia at its insertion  noted.  Pain noted on the posterior heel at the insertion of the achilles distally on the calcaneus and it travels proximally.  Decreased range of motion at the ankle joint is noted.   DERMATOLOGIC: skin is warm, supple, and dry.  No open lesions noted.  No rash, no pre ulcerative lesions. Digital nails are asymptomatic.    Xray shows no cute osseous abnormalities present.  Posterior calcaneal spur is noted.  Some inflammation along the distal portion of the Achilles tendon is seen.     Assessment   Achilles tendonitis left  foot.  Plan  Treatment options were discussed with the patient.  I recommended a short air fracture boot with a heel lift in order to immobilize her Achilles tendon and the patient related the boot was too big and too heavy and she did not feel comfortable wearing it.  She is currently on an antibiotic for her stomach so is unable to take an oral medication.  I recommended stretching exercises for her and use of a heel lift as well as ice. She will return for follow up in 1 month.

## 2021-03-19 ENCOUNTER — Ambulatory Visit (INDEPENDENT_AMBULATORY_CARE_PROVIDER_SITE_OTHER): Payer: Medicare Other | Admitting: Podiatry

## 2021-03-19 ENCOUNTER — Other Ambulatory Visit: Payer: Self-pay

## 2021-03-19 ENCOUNTER — Encounter: Payer: Self-pay | Admitting: Podiatry

## 2021-03-19 DIAGNOSIS — M216X2 Other acquired deformities of left foot: Secondary | ICD-10-CM | POA: Diagnosis not present

## 2021-03-19 DIAGNOSIS — M7732 Calcaneal spur, left foot: Secondary | ICD-10-CM | POA: Diagnosis not present

## 2021-03-19 DIAGNOSIS — M7662 Achilles tendinitis, left leg: Secondary | ICD-10-CM | POA: Diagnosis not present

## 2021-03-19 MED ORDER — DICLOFENAC SODIUM 1 % EX GEL: 2.0000 g | Freq: Four times a day (QID) | CUTANEOUS | 2 refills | Status: DC

## 2021-03-19 NOTE — Patient Instructions (Signed)

## 2021-03-20 ENCOUNTER — Telehealth: Payer: Self-pay | Admitting: Podiatry

## 2021-03-20 MED ORDER — IBUPROFEN 800 MG PO TABS: 800.0000 mg | ORAL_TABLET | Freq: Three times a day (TID) | ORAL | 0 refills | Status: DC | PRN

## 2021-03-20 NOTE — Telephone Encounter (Signed)
Patient called and stated she would Like Dr. Jacqualyn Posey to go ahead and send the high dosage of ibuprofen to her pharmacy

## 2021-03-25 NOTE — Progress Notes (Signed)
Subjective: 55 year old female presents the office today for evaluation left posterior heel pain.  She previously saw Dr. Valentina Lucks for this and he lives with dispensed and has helped some.  She is requesting steroid injection.  No recent injury or trauma or changes otherwise.  Objective: AAO x3, NAD DP/PT pulses palpable bilaterally, CRT less than 3 seconds There is tenderness palpation on the posterior aspect left heel on the prominent bone spur as well as the distal portion Achilles tendon.  Clinically the Achilles tendon appears to be intact.  Thompson test is negative.  There is no pain with lateral compression of calcaneus.  No other areas of discomfort.  MMT 5/5.  Equinus present. No pain with calf compression, swelling, warmth, erythema  Assessment: Posterior heel spur, insertional Achilles tendinitis  Plan: -All treatment options discussed with the patient including all alternatives, risks, complications.  -Independently reviewed the x-rays and showed them to her as well.  Posterior spurring present. -She requested steroid injection but advised against this given the risks.  She is getting purchased an over-the-counter night splint.  Voltaren gel.  Discussed shoes and better arch support.  Home stretching, icing daily.  If needed will refer to physical therapy.  She is in agreement with this plan. -Patient encouraged to call the office with any questions, concerns, change in symptoms.   Trula Slade DPM

## 2021-04-26 ENCOUNTER — Telehealth: Payer: Self-pay | Admitting: Internal Medicine

## 2021-04-26 NOTE — Telephone Encounter (Signed)
Pt needs a statement from [employer] saying pt is on disability.  Pt needs for assistance in a program for hardship.  Pt states she was seeing zelda at the time her disability was approved. Pt needs by nov 1.  Pt is aware z[employer]da is out until 11/01. Pt would like to pick up when ready.  Please .

## 2021-04-29 NOTE — Telephone Encounter (Signed)
Pt will need to contact her PCP which was seen on 02/19/2021 to have paperwork filled out.

## 2021-05-10 ENCOUNTER — Other Ambulatory Visit: Payer: Self-pay | Admitting: Podiatry

## 2021-05-10 ENCOUNTER — Telehealth: Payer: Self-pay | Admitting: Podiatry

## 2021-05-10 MED ORDER — IBUPROFEN 800 MG PO TABS: 800.0000 mg | ORAL_TABLET | Freq: Three times a day (TID) | ORAL | 0 refills | Status: AC | PRN

## 2021-05-10 NOTE — Telephone Encounter (Signed)
Pt requested refill on ibuprofen (ADVIL) 800 MG tablet.   Please advise

## 2021-05-20 ENCOUNTER — Ambulatory Visit: Payer: Medicare Other | Admitting: Podiatry

## 2021-05-30 ENCOUNTER — Ambulatory Visit: Payer: Medicare Other | Admitting: Physician Assistant

## 2021-11-19 ENCOUNTER — Encounter (HOSPITAL_BASED_OUTPATIENT_CLINIC_OR_DEPARTMENT_OTHER): Payer: Self-pay | Admitting: Emergency Medicine

## 2021-11-19 ENCOUNTER — Emergency Department (HOSPITAL_BASED_OUTPATIENT_CLINIC_OR_DEPARTMENT_OTHER)
Admission: EM | Admit: 2021-11-19 | Discharge: 2021-11-19 | Disposition: A | Discharge status: Home / Self Care | Payer: Medicare Other | Attending: Emergency Medicine | Admitting: Emergency Medicine

## 2021-11-19 ENCOUNTER — Other Ambulatory Visit: Payer: Self-pay

## 2021-11-19 ENCOUNTER — Emergency Department (HOSPITAL_BASED_OUTPATIENT_CLINIC_OR_DEPARTMENT_OTHER): Payer: Medicare Other

## 2021-11-19 DIAGNOSIS — R42 Dizziness and giddiness: Secondary | ICD-10-CM

## 2021-11-19 DIAGNOSIS — Z91040 Latex allergy status: Secondary | ICD-10-CM | POA: Insufficient documentation

## 2021-11-19 DIAGNOSIS — R519 Headache, unspecified: Secondary | ICD-10-CM | POA: Diagnosis present

## 2021-11-19 DIAGNOSIS — Z79899 Other long term (current) drug therapy: Secondary | ICD-10-CM | POA: Diagnosis not present

## 2021-11-19 DIAGNOSIS — Z20822 Contact with and (suspected) exposure to covid-19: Secondary | ICD-10-CM | POA: Diagnosis not present

## 2021-11-19 DIAGNOSIS — I10 Essential (primary) hypertension: Secondary | ICD-10-CM | POA: Insufficient documentation

## 2021-11-19 LAB — RESP PANEL BY RT-PCR (FLU A&B, COVID) ARPGX2
Influenza A by PCR: NEGATIVE
Influenza B by PCR: NEGATIVE
SARS Coronavirus 2 by RT PCR: NEGATIVE

## 2021-11-19 LAB — BASIC METABOLIC PANEL
Anion gap: 9 (ref 5–15)
BUN: 11 mg/dL (ref 6–20)
CO2: 28 mmol/L (ref 22–32)
Calcium: 10 mg/dL (ref 8.9–10.3)
Chloride: 102 mmol/L (ref 98–111)
Creatinine, Ser: 0.78 mg/dL (ref 0.44–1.00)
GFR, Estimated: >60 mL/min (ref >60)
Glucose, Bld: 116 mg/dL — ABNORMAL HIGH (ref 70–99)
Potassium: 3.7 mmol/L (ref 3.5–5.1)
Sodium: 139 mmol/L (ref 135–145)

## 2021-11-19 LAB — CBC WITH DIFFERENTIAL/PLATELET
Abs Immature Granulocytes: 0.03 10*3/uL (ref 0.00–0.07)
Basophils Absolute: 0 10*3/uL (ref 0.0–0.1)
Basophils Relative: 1 %
Eosinophils Absolute: 0.1 10*3/uL (ref 0.0–0.5)
Eosinophils Relative: 1 %
HCT: 36.2 % (ref 36.0–46.0)
Hemoglobin: 11.6 g/dL — ABNORMAL LOW (ref 12.0–15.0)
Immature Granulocytes: 1 %
Lymphocytes Relative: 43 %
Lymphs Abs: 2.7 10*3/uL (ref 0.7–4.0)
MCH: 30.5 pg (ref 26.0–34.0)
MCHC: 32 g/dL (ref 30.0–36.0)
MCV: 95.3 fL (ref 80.0–100.0)
Monocytes Absolute: 0.6 10*3/uL (ref 0.1–1.0)
Monocytes Relative: 9 %
Neutro Abs: 2.9 10*3/uL (ref 1.7–7.7)
Neutrophils Relative %: 45 %
Platelets: 277 10*3/uL (ref 150–400)
RBC: 3.8 MIL/uL — ABNORMAL LOW (ref 3.87–5.11)
RDW: 13.5 % (ref 11.5–15.5)
WBC: 6.3 10*3/uL (ref 4.0–10.5)
nRBC: 0 % (ref 0.0–0.2)

## 2021-11-19 MED ORDER — METOCLOPRAMIDE HCL 5 MG/ML IJ SOLN
10.0000 mg | Freq: Once | INTRAMUSCULAR | Status: AC
  Administered 2021-11-19: 10 mg via INTRAVENOUS
  Filled 2021-11-19: qty 2

## 2021-11-19 MED ORDER — SODIUM CHLORIDE 0.9 % IV BOLUS
1000.0000 mL | Freq: Once | INTRAVENOUS | Status: AC
Start: 2021-11-19 — End: 2021-11-19
  Administered 2021-11-19: 1000 mL via INTRAVENOUS

## 2021-11-19 MED ORDER — CETIRIZINE HCL 10 MG PO TABS: 10.0000 mg | ORAL_TABLET | Freq: Every day | ORAL | 0 refills | Status: DC

## 2021-11-19 MED ORDER — KETOROLAC TROMETHAMINE 15 MG/ML IJ SOLN
15.0000 mg | Freq: Once | INTRAMUSCULAR | Status: AC
  Administered 2021-11-19: 15 mg via INTRAVENOUS
  Filled 2021-11-19: qty 1

## 2021-11-19 MED ORDER — DIPHENHYDRAMINE HCL 50 MG/ML IJ SOLN
50.0000 mg | Freq: Once | INTRAMUSCULAR | Status: AC
  Administered 2021-11-19: 50 mg via INTRAVENOUS
  Filled 2021-11-19: qty 1

## 2021-11-19 MED ORDER — FLUTICASONE PROPIONATE 50 MCG/ACT NA SUSP: 1.0000 | Freq: Every day | NASAL | 2 refills | Status: DC

## 2021-11-19 NOTE — ED Notes (Signed)
Reviewed AVS/discharge instruction with patient. Time allotted for and all questions answered. Patient is agreeable for d/c and escorted to ed exit by staff.  

## 2021-11-19 NOTE — ED Triage Notes (Signed)
Pt from home and c/o of headache,  lightheadedness and dizziness for two days. Pt stated that the dizzies got worse today and is accompanied by Nausea. Pt denis any episodes of emesis.

## 2021-11-19 NOTE — ED Notes (Signed)
Pt is currently laying flat for orthostatics.

## 2021-11-19 NOTE — ED Provider Notes (Signed)
[facility] EMERGENCY DEPT Provider Note   CSN: [id number] Arrival date & time: 11/19/21  1428     History  Chief Complaint  Patient presents with   Dizziness    Sheila Yoder is a 56 y.o. female with PMHx HTN and anemia who presents to the ED today with complaint of gradual onset, constant, achy, diffuse frontal headache that began 2 days ago. Pt reports intermittent sharp pains in her temples. She also complains of nausea. Today she was at the grocery store when she began feeling lightheaded and dizzy. Pt reports she felt like she could faint but then also states she feels like everything was going to go down. She denies room spinning dizziness. She states she has issues with fluid in her ear and will have difficulty with balance due to same. Denies any worsening of this today. Denies any vision changes, speech changes, unilateral weakness or numbness, or any other associated symptoms. Pt has been taking Tylenol for her headache without relief. Pt is requesting a COVID test today as she has been around children recently who were sneezing frequently. No fevers or chills. No neck stiffness or rash.   The history is provided by the patient and medical records.      Home Medications Prior to Admission medications   Medication Sig Start Date End Date Taking? Authorizing Provider  cetirizine (ZYRTEC ALLERGY) 10 MG tablet Take 1 tablet (10 mg total) by mouth daily. 11/19/21 12/19/21 Yes Lash Matulich, PA-C  fluticasone (FLONASE) 50 MCG/ACT nasal spray Place 1 spray into both nostrils daily. 11/19/21  Yes Kuba Shepherd, PA-C  acetaminophen (TYLENOL) 500 MG tablet Take 1,000 mg by mouth 2 (two) times daily as needed for headache.    [provider]  amLODipine (NORVASC) 10 MG tablet Take 10 mg by mouth daily.    [provider]  amLODipine (NORVASC) 5 MG tablet Take by mouth. 02/19/21   [provider]  amLODipine (NORVASC) 5 MG tablet TAKE 2 TABLETS (10  MG TOTAL) BY MOUTH DAILY. 11/02/19 11/01/20  Gildardo Pounds, NP  Ascorbic Acid (VITAMIN C WITH ROSE HIPS) 500 MG tablet Take 1,500 mg by mouth daily.    [provider]  ascorbic acid (VITAMIN C) 1000 MG tablet Take by mouth.    [provider]  atorvastatin (LIPITOR) 40 MG tablet TAKE 1 TABLET (40 MG TOTAL) BY MOUTH DAILY. 01/05/20 01/04/21  Gildardo Pounds, NP  Cholecalciferol (DIALYVITE VITAMIN D 5000 PO) Take 5,000 Units by mouth daily.    [provider]  diclofenac Sodium (VOLTAREN) 1 % GEL Apply 2 g topically 4 (four) times daily. Rub into affected area of foot 2 to 4 times daily 03/19/21   Trula Slade, DPM  escitalopram (LEXAPRO) 10 MG tablet  10/03/20   [provider]  famotidine (PEPCID) 20 MG tablet Take by mouth.    [provider]  fluticasone (FLONASE) 50 MCG/ACT nasal spray Place 2 sprays into both nostrils daily. 11/02/19   Gildardo Pounds, NP  fluticasone (FLONASE) 50 MCG/ACT nasal spray Place into the nose. 11/05/20   [provider]  fluticasone (FLONASE) 50 MCG/ACT nasal spray PLACE 2 SPRAYS INTO BOTH NOSTRILS DAILY. 11/02/19 11/01/20  Gildardo Pounds, NP  Garlic 2458 MG CAPS Take 1,000 mg by mouth daily.    [provider]  ibuprofen (ADVIL) 800 MG tablet Take 1 tablet (800 mg total) by mouth every 8 (eight) hours as needed. 05/10/21   Trula Slade, DPM  lansoprazole (PREVACID) 30 MG capsule Take 30 mg by mouth daily at 12 noon.    [provider]  lansoprazole (PREVACID) 30 MG capsule TAKE 1 CAPSULE (30 MG TOTAL) BY MOUTH 2 (TWO) TIMES DAILY BEFORE A MEAL. 11/02/19 11/01/20  Gildardo Pounds, NP  levalbuterol [facility] HFA) 45 MCG/ACT inhaler SMARTSIG:1-2 Puff(s) Via Inhaler Every 4 Hours PRN 01/07/21   [provider]  loratadine (CLARITIN) 10 MG tablet Take by mouth.    [provider]  Multiple Vitamin (MULTIVITAMIN) tablet Take 1 tablet by mouth daily.    [provider]   omeprazole (PRILOSEC) 40 MG capsule TAKE 1 CAPSULE (40 MG TOTAL) BY MOUTH DAILY. 02/03/20 02/02/21  Charlott Rakes, MD  oxyCODONE (ROXICODONE) 5 MG immediate release tablet Take 1 tablet (5 mg total) by mouth every 4 (four) hours as needed for severe pain. Patient not taking: Reported on 0/08/5007 38/18/29   Delora Fuel, MD  pantoprazole (PROTONIX) 40 MG tablet Take 40 mg by mouth daily. 02/11/21   [provider]  Probiotic Product (PROBIOTIC PO) Take 1 capsule by mouth daily.    [provider]  prochlorperazine (COMPAZINE) 10 MG tablet Take 1 tablet (10 mg total) by mouth every 6 (six) hours as needed for nausea or vomiting. Patient not taking: Reported on 03/02/7168 67/89/38   Delora Fuel, MD  Zinc 50 MG TABS Take 50 mg by mouth daily.    [provider]  buPROPion (WELLBUTRIN XL) 150 MG 24 hr tablet Take 1 tablet (150 mg total) by mouth daily. 11/02/19 04/26/20  Gildardo Pounds, NP  esomeprazole (NEXIUM) 40 MG capsule Take 1 capsule (40 mg total) by mouth daily at 12 noon. 01/05/19 04/02/19  Gildardo Pounds, NP  misoprostol (CYTOTEC) 200 MCG tablet Take 3 pills by mouth the night before biopsy. Patient not taking: Reported on 11/02/2019 09/01/19 04/26/20  Emily Filbert, MD  pravastatin (PRAVACHOL) 40 MG tablet Take 1 tablet (40 mg total) by mouth daily. Patient not taking: Reported on 04/25/2020 01/09/20 04/26/20  Gildardo Pounds, NP      Allergies    Atorvastatin, Latex, Rosuvastatin, Bactrim [sulfamethoxazole-trimethoprim], and Other    Review of Systems   Review of Systems  Constitutional:  Negative for chills and fever.  Eyes:  Negative for visual disturbance.  Respiratory:  Negative for cough and shortness of breath.   Gastrointestinal:  Positive for nausea. Negative for vomiting.  Neurological:  Positive for dizziness, light-headedness and headaches. Negative for speech difficulty, weakness and numbness.  All other systems reviewed and are negative.  Physical  Exam Updated Vital Signs BP (!) 152/84   Pulse 67   Temp 99 F (37.2 C) (Oral)   Resp 16   Ht '5\' 6"'$  (1.676 m)   Wt (!) 150.1 kg   SpO2 98%   BMI 53.41 kg/m  Physical Exam Vitals and nursing note reviewed.  Constitutional:      Appearance: She is not ill-appearing or diaphoretic.  HENT:     Head: Normocephalic and atraumatic.  Eyes:     Extraocular Movements: Extraocular movements intact.     Conjunctiva/sclera: Conjunctivae normal.     Pupils: Pupils are equal, round, and reactive to light.     Comments: No nystagmus  Cardiovascular:     Rate and Rhythm: Normal rate and regular rhythm.     Pulses: Normal pulses.  Pulmonary:     Effort: Pulmonary effort is normal.     Breath sounds: Normal breath sounds. No  wheezing, rhonchi or rales.  Abdominal:     Palpations: Abdomen is soft.     Tenderness: There is no abdominal tenderness.  Musculoskeletal:     Cervical back: Neck supple.  Skin:    General: Skin is warm and dry.  Neurological:     Mental Status: She is alert.     Comments: Alert and oriented to self, place, time and event.   Speech is fluent, clear without dysarthria or dysphasia.   Strength 5/5 in upper/lower extremities   Sensation intact in upper/lower extremities   Normal gait.  Negative Romberg. No pronator drift.  Normal finger-to-nose and feet tapping.  CN I not tested  CN II grossly intact visual fields bilaterally. Did not visualize posterior eye.  CN III, IV, VI PERRLA and EOMs intact bilaterally  CN V Intact sensation to sharp and light touch to the face  CN VII facial movements symmetric  CN VIII not tested  CN IX, X no uvula deviation, symmetric rise of soft palate  CN XI 5/5 SCM and trapezius strength bilaterally  CN XII Midline tongue protrusion, symmetric L/R movements     ED Results / Procedures / Treatments   Labs (all labs ordered are listed, but only abnormal results are displayed) Labs Reviewed  CBC WITH DIFFERENTIAL/PLATELET -  Abnormal; Notable for the following components:      Result Value   RBC 3.80 (*)    Hemoglobin 11.6 (*)    All other components within normal limits  BASIC METABOLIC PANEL - Abnormal; Notable for the following components:   Glucose, Bld 116 (*)    All other components within normal limits  RESP PANEL BY RT-PCR (FLU A&B, COVID) [id number]    EKG None  Radiology CT Head Wo Contrast  Result Date: 11/19/2021 CLINICAL DATA:  Headaches, dizziness EXAM: CT HEAD WITHOUT CONTRAST TECHNIQUE: Contiguous axial images were obtained from the base of the skull through the vertex without intravenous contrast. RADIATION DOSE REDUCTION: This exam was performed according to the departmental dose-optimization program which includes automated exposure control, adjustment of the mA and/or kV according to patient size and/or use of iterative reconstruction technique. COMPARISON:  None Available. FINDINGS: Brain: No acute intracranial findings are seen. There are no signs of bleeding within the cranium. Ventricles are not dilated. There is no focal mass effect. Vascular: Unremarkable. Skull: No fracture is seen. Sinuses/Orbits: There is mild exophthalmos. Other: None. IMPRESSION: No acute intracranial findings are seen in the noncontrast CT brain. Electronically Signed   By: Elmer Picker M.D.   On: 11/19/2021 16:06    Procedures Procedures    Medications Ordered in ED Medications  sodium chloride 0.9 % bolus 1,000 mL (0 mLs Intravenous Stopped 11/19/21 1649)  metoCLOPramide (REGLAN) injection 10 mg (10 mg Intravenous Given 11/19/21 1511)  diphenhydrAMINE (BENADRYL) injection 50 mg (50 mg Intravenous Given 11/19/21 1508)  ketorolac (TORADOL) 15 MG/ML injection 15 mg (15 mg Intravenous Given 11/19/21 1625)    ED Course/ Medical Decision Making/ A&P                           Medical Decision Making 56 year old female who presents to the ED today with complaint of headache for the past 2 days with associated  nausea and lightheadedness/dizziness that began earlier today.  She describes her lightheadedness as feeling like she could pass out however then states that she felt like everything was going to " drop" around her.  Denies any  gait instability, room spinning dizziness.  On arrival to the ED today patient's blood pressure is elevated 183/88.  She takes 5 mg amlodipine and has been compliant with her dosage.  She has no focal neurodeficits on exam at this time.  No nystagmus to suggest vertigo and no history of same.  Given no history of headaches in the past we will plan for CT head for further eval.  Given nausea and headache mostly suspicious for concern of migraine. Low suspicion for stroke at this time without any neuro deficits.  Will provide headache cocktail and reevaluate.   CBC without leukocytosis. Hgb stable at 11.6 BMP without electrolyte abnormalities  COVID and flu negative  CT head negative  On reevaluation pt resting comfortably and reports improvement in symptoms. She is concerned she could have an ear infection - her ears are without erythema bilaterally however does have some fluid behind TM. Will discharge with flonase and zyrtec. Pt recommended to follow up with PCP for further eval. Her blood pressure has been fluctuating here in the ED and she is only on 5 mg Amlodipine. Question need for additional hypertensive meds. Pt in agreement with plan and stable for discharge home.   Problems Addressed: Bad headache: acute illness or injury Lightheadedness: acute illness or injury Primary hypertension: acute illness or injury  Amount and/or Complexity of Data Reviewed Labs: ordered. Radiology: ordered.  Risk Prescription drug management.          Final Clinical Impression(s) / ED Diagnoses Final diagnoses:  Bad headache  Primary hypertension  Lightheadedness    Rx / DC Orders ED Discharge Orders          Ordered    fluticasone (FLONASE) 50 MCG/ACT nasal spray   Daily        11/19/21 1649    cetirizine (ZYRTEC ALLERGY) 10 MG tablet  Daily        11/19/21 1649             Discharge Instructions      Please follow up with your PCP for further evaluation of your symptoms. Pick up medication and take as prescribed for the fluid in your ears.   Return to the ED for any new/worsening symptoms        Eustaquio Maize, Hershal Coria 11/19/21 1650    Lajean Saver, MD 11/20/21 1039

## 2021-11-19 NOTE — ED Notes (Signed)
Patient from CT. 

## 2021-11-19 NOTE — Discharge Instructions (Signed)
Please follow up with your PCP for further evaluation of your symptoms. Pick up medication and take as prescribed for the fluid in your ears.   Return to the ED for any new/worsening symptoms

## 2022-02-26 ENCOUNTER — Other Ambulatory Visit (HOSPITAL_BASED_OUTPATIENT_CLINIC_OR_DEPARTMENT_OTHER): Payer: Self-pay

## 2022-05-15 ENCOUNTER — Encounter: Payer: Self-pay | Admitting: Oncology

## 2022-11-11 ENCOUNTER — Other Ambulatory Visit: Payer: Self-pay | Admitting: Physician Assistant

## 2022-11-11 DIAGNOSIS — R519 Headache, unspecified: Secondary | ICD-10-CM

## 2022-12-18 ENCOUNTER — Other Ambulatory Visit: Payer: Medicare Other

## 2022-12-19 ENCOUNTER — Other Ambulatory Visit: Payer: Medicare Other

## 2023-02-12 ENCOUNTER — Emergency Department (HOSPITAL_COMMUNITY)
Admission: EM | Admit: 2023-02-12 | Discharge: 2023-02-12 | Disposition: A | Discharge status: Home / Self Care | Payer: Medicare Other | Attending: Emergency Medicine | Admitting: Emergency Medicine

## 2023-02-12 DIAGNOSIS — M25511 Pain in right shoulder: Secondary | ICD-10-CM | POA: Diagnosis not present

## 2023-02-12 DIAGNOSIS — Z91040 Latex allergy status: Secondary | ICD-10-CM | POA: Insufficient documentation

## 2023-02-12 MED ORDER — LIDOCAINE 5 % EX PTCH
1.0000 | MEDICATED_PATCH | CUTANEOUS | Status: DC
  Administered 2023-02-12: 1 via TRANSDERMAL
  Filled 2023-02-12: qty 1

## 2023-02-12 NOTE — ED Triage Notes (Signed)
Pt c/o R arm pain since yesterday. States "it's my whole arm". Endorses starting a new workout regimen day before the pain started with emphasis on arm movements per pt.

## 2023-02-12 NOTE — Discharge Instructions (Signed)
You can try Tylenol, Motrin, over-the-counter lidocaine patches to help with your pain.  I recommend you follow-up with your doctor within the next few days.  If you develop severe pain, numbness, weakness or any other new concerning symptoms you should return to the ED.

## 2023-02-12 NOTE — ED Provider Notes (Signed)
[place] EMERGENCY DEPARTMENT AT [facility] Provider Note   CSN: [id number] Arrival date & time: 02/12/23  6433     History  Chief Complaint  Patient presents with   Arm Pain    Sheila Yoder is a 57 y.o. female.   Arm Pain  57 year old female presenting for right arm pain.  Symptoms started yesterday after she tried a new workout routine with arm exercises.  Pain is primarily over her right deltoid and mid arm.  It is worse with movement and palpation.  No skin changes.  She has no numbness or tingling or weakness.  She has no chest pain, nausea, vomiting, shortness of breath.  She has not had any falls or other acute injury.  She is otherwise been at her baseline health.  She states she has follow-up with her PCP in the next 2 days.  She tried a dose of Motrin which did not significantly help her pain.     Home Medications Prior to Admission medications   Medication Sig Start Date End Date Taking? Authorizing Provider  acetaminophen (TYLENOL) 500 MG tablet Take 1,000 mg by mouth 2 (two) times daily as needed for headache.    [provider]  amLODipine (NORVASC) 10 MG tablet Take 10 mg by mouth daily.    [provider]  amLODipine (NORVASC) 5 MG tablet Take by mouth. 02/19/21   [provider]  amLODipine (NORVASC) 5 MG tablet TAKE 2 TABLETS (10 MG TOTAL) BY MOUTH DAILY. 11/02/19 11/01/20  Claiborne Rigg, NP  Ascorbic Acid (VITAMIN C WITH ROSE HIPS) 500 MG tablet Take 1,500 mg by mouth daily.    [provider]  ascorbic acid (VITAMIN C) 1000 MG tablet Take by mouth.    [provider]  atorvastatin (LIPITOR) 40 MG tablet TAKE 1 TABLET (40 MG TOTAL) BY MOUTH DAILY. 01/05/20 01/04/21  Claiborne Rigg, NP  cetirizine (ZYRTEC ALLERGY) 10 MG tablet Take 1 tablet (10 mg total) by mouth daily. 11/19/21 12/19/21  Tanda Rockers, PA-C  Cholecalciferol (DIALYVITE VITAMIN D 5000 PO) Take 5,000 Units by mouth daily.    [provider]  diclofenac Sodium (VOLTAREN) 1 % GEL Apply 2 g topically 4 (four) times daily. Rub into affected area of foot 2 to 4 times daily 03/19/21   Vivi Barrack, DPM  escitalopram (LEXAPRO) 10 MG tablet  10/03/20   [provider]  famotidine (PEPCID) 20 MG tablet Take by mouth.    [provider]  fluticasone (FLONASE) 50 MCG/ACT nasal spray Place 2 sprays into both nostrils daily. 11/02/19   Claiborne Rigg, NP  fluticasone (FLONASE) 50 MCG/ACT nasal spray Place into the nose. 11/05/20   [provider]  fluticasone (FLONASE) 50 MCG/ACT nasal spray PLACE 2 SPRAYS INTO BOTH NOSTRILS DAILY. 11/02/19 11/01/20  Claiborne Rigg, NP  fluticasone (FLONASE) 50 MCG/ACT nasal spray Place 1 spray into both nostrils daily. 11/19/21   Tanda Rockers, PA-C  Garlic 1000 MG CAPS Take 1,000 mg by mouth daily.    [provider]  ibuprofen (ADVIL) 800 MG tablet Take 1 tablet (800 mg total) by mouth every 8 (eight) hours as needed. 05/10/21   Vivi Barrack, DPM  lansoprazole (PREVACID) 30 MG capsule Take 30 mg by mouth daily at 12 noon.    [provider]  lansoprazole (PREVACID) 30 MG capsule TAKE 1 CAPSULE (30 MG TOTAL) BY MOUTH 2 (TWO) TIMES DAILY BEFORE A MEAL. 11/02/19 11/01/20  Bertram Denver  W, NP  levalbuterol (XOPENEX HFA) 45 MCG/ACT inhaler SMARTSIG:1-2 Puff(s) Via Inhaler Every 4 Hours PRN 01/07/21   [provider]  loratadine (CLARITIN) 10 MG tablet Take by mouth.    [provider]  Multiple Vitamin (MULTIVITAMIN) tablet Take 1 tablet by mouth daily.    [provider]  omeprazole (PRILOSEC) 40 MG capsule TAKE 1 CAPSULE (40 MG TOTAL) BY MOUTH DAILY. 02/03/20 02/02/21  Hoy Register, MD  oxyCODONE (ROXICODONE) 5 MG immediate release tablet Take 1 tablet (5 mg total) by mouth every 4 (four) hours as needed for severe pain. Patient not taking: Reported on 08/28/2020 04/26/20   Dione Booze, MD  pantoprazole (PROTONIX) 40 MG tablet Take 40 mg by  mouth daily. 02/11/21   [provider]  Probiotic Product (PROBIOTIC PO) Take 1 capsule by mouth daily.    [provider]  prochlorperazine (COMPAZINE) 10 MG tablet Take 1 tablet (10 mg total) by mouth every 6 (six) hours as needed for nausea or vomiting. Patient not taking: Reported on 08/28/2020 04/26/20   Dione Booze, MD  Zinc 50 MG TABS Take 50 mg by mouth daily.    [provider]  buPROPion (WELLBUTRIN XL) 150 MG 24 hr tablet Take 1 tablet (150 mg total) by mouth daily. 11/02/19 04/26/20  Claiborne Rigg, NP  esomeprazole (NEXIUM) 40 MG capsule Take 1 capsule (40 mg total) by mouth daily at 12 noon. 01/05/19 04/02/19  Claiborne Rigg, NP  misoprostol (CYTOTEC) 200 MCG tablet Take 3 pills by mouth the night before biopsy. Patient not taking: Reported on 11/02/2019 09/01/19 04/26/20  Allie Bossier, MD  pravastatin (PRAVACHOL) 40 MG tablet Take 1 tablet (40 mg total) by mouth daily. Patient not taking: Reported on 04/25/2020 01/09/20 04/26/20  Claiborne Rigg, NP      Allergies    Atorvastatin, Latex, Rosuvastatin, Bactrim [sulfamethoxazole-trimethoprim], and Other    Review of Systems   Review of Systems Review of systems completed and notable as per HPI.  ROS otherwise negative.   Physical Exam Updated Vital Signs BP (!) 184/88 (BP Location: Left Arm)   Pulse 92   Temp 98.6 F (37 C) (Oral)   Resp 18   SpO2 95%  Physical Exam Vitals and nursing note reviewed.  Constitutional:      General: She is not in acute distress.    Appearance: She is well-developed.  HENT:     Head: Normocephalic and atraumatic.     Nose: Nose normal.     Mouth/Throat:     Mouth: Mucous membranes are moist.     Pharynx: Oropharynx is clear.  Eyes:     Extraocular Movements: Extraocular movements intact.     Conjunctiva/sclera: Conjunctivae normal.     Pupils: Pupils are equal, round, and reactive to light.  Cardiovascular:     Rate and Rhythm: Normal rate and regular rhythm.      Heart sounds: No murmur heard. Pulmonary:     Effort: Pulmonary effort is normal. No respiratory distress.     Breath sounds: Normal breath sounds.  Abdominal:     Palpations: Abdomen is soft.     Tenderness: There is no abdominal tenderness.  Musculoskeletal:        General: No swelling.     Cervical back: Normal range of motion and neck supple. No rigidity or tenderness.     Right lower leg: No edema.     Left lower leg: No edema.     Comments: Mild  tenderness over the right deltoid.  Mild pain with range of motion.  She has good strength but does have pain with abduction of the right shoulder which reproduces her pain.  She is 2+ radial pulse.  Normal strength and sensation throughout the right upper extremity.  No skin changes.  Skin:    General: Skin is warm and dry.     Capillary Refill: Capillary refill takes less than 2 seconds.  Neurological:     Mental Status: She is alert.  Psychiatric:        Mood and Affect: Mood normal.     ED Results / Procedures / Treatments   Labs (all labs ordered are listed, but only abnormal results are displayed) Labs Reviewed - No data to display  EKG None  Radiology No results found.  Procedures Procedures    Medications Ordered in ED Medications  lidocaine (LIDODERM) 5 % 1 patch (has no administration in time range)    ED Course/ Medical Decision Making/ A&P                                 Medical Decision Making  Medical Decision Making:   Sheila Yoder is a 57 y.o. female who presented to the ED today with right shoulder pain.  Vital signs notable for hypertension.  On exam she is well-appearing.  She has mild right shoulder pain after new workout routine.  Pain is reproducible over her deltoid and worse with abduction consistent likely muscular strain.  She has not any fall or bony tenderness, low concern for fracture and I do not think imaging is warranted at this time.  She is neurovascularly intact.  Will trial  lidocaine patch, Tylenol, Motrin.  She has follow-up with her PCP in 2 days.  Encouraged her to keep this appointment gave her strict return precautions.  Patient's presentation is most consistent with acute, uncomplicated illness.           Final Clinical Impression(s) / ED Diagnoses Final diagnoses:  Acute pain of right shoulder    Rx / DC Orders ED Discharge Orders     None         Laurence Spates, MD 02/12/23 1000

## 2023-07-25 ENCOUNTER — Emergency Department (HOSPITAL_COMMUNITY): Payer: Medicare Other

## 2023-07-25 ENCOUNTER — Emergency Department (HOSPITAL_COMMUNITY)
Admission: EM | Admit: 2023-07-25 | Discharge: 2023-07-25 | Disposition: A | Discharge status: Home / Self Care | Payer: Medicare Other | Attending: Emergency Medicine | Admitting: Emergency Medicine

## 2023-07-25 DIAGNOSIS — I10 Essential (primary) hypertension: Secondary | ICD-10-CM | POA: Diagnosis not present

## 2023-07-25 DIAGNOSIS — Z79899 Other long term (current) drug therapy: Secondary | ICD-10-CM | POA: Insufficient documentation

## 2023-07-25 DIAGNOSIS — G43809 Other migraine, not intractable, without status migrainosus: Secondary | ICD-10-CM | POA: Insufficient documentation

## 2023-07-25 DIAGNOSIS — R519 Headache, unspecified: Secondary | ICD-10-CM | POA: Diagnosis present

## 2023-07-25 DIAGNOSIS — Z91040 Latex allergy status: Secondary | ICD-10-CM | POA: Insufficient documentation

## 2023-07-25 LAB — BASIC METABOLIC PANEL
Anion gap: 12 (ref 5–15)
BUN: 10 mg/dL (ref 6–20)
CO2: 26 mmol/L (ref 22–32)
Calcium: 9.3 mg/dL (ref 8.9–10.3)
Chloride: 99 mmol/L (ref 98–111)
Creatinine, Ser: 0.57 mg/dL (ref 0.44–1.00)
GFR, Estimated: >60 mL/min (ref >60)
Glucose, Bld: 101 mg/dL — ABNORMAL HIGH (ref 70–99)
Potassium: 4.3 mmol/L (ref 3.5–5.1)
Sodium: 137 mmol/L (ref 135–145)

## 2023-07-25 LAB — CBC WITH DIFFERENTIAL/PLATELET
Abs Immature Granulocytes: 0.04 10*3/uL (ref 0.00–0.07)
Basophils Absolute: 0 10*3/uL (ref 0.0–0.1)
Basophils Relative: 1 %
Eosinophils Absolute: 0 10*3/uL (ref 0.0–0.5)
Eosinophils Relative: 1 %
HCT: 35.6 % — ABNORMAL LOW (ref 36.0–46.0)
Hemoglobin: 11.3 g/dL — ABNORMAL LOW (ref 12.0–15.0)
Immature Granulocytes: 1 %
Lymphocytes Relative: 40 %
Lymphs Abs: 2.1 10*3/uL (ref 0.7–4.0)
MCH: 30.8 pg (ref 26.0–34.0)
MCHC: 31.7 g/dL (ref 30.0–36.0)
MCV: 97 fL (ref 80.0–100.0)
Monocytes Absolute: 0.4 10*3/uL (ref 0.1–1.0)
Monocytes Relative: 8 %
Neutro Abs: 2.6 10*3/uL (ref 1.7–7.7)
Neutrophils Relative %: 49 %
Platelets: 271 10*3/uL (ref 150–400)
RBC: 3.67 MIL/uL — ABNORMAL LOW (ref 3.87–5.11)
RDW: 13.2 % (ref 11.5–15.5)
WBC: 5.2 10*3/uL (ref 4.0–10.5)
nRBC: 0 % (ref 0.0–0.2)

## 2023-07-25 MED ORDER — BUTALBITAL-APAP-CAFFEINE 50-325-40 MG PO TABS: 1.0000 | ORAL_TABLET | Freq: Four times a day (QID) | ORAL | 0 refills | Status: DC | PRN

## 2023-07-25 MED ORDER — PROCHLORPERAZINE EDISYLATE 10 MG/2ML IJ SOLN
10.0000 mg | Freq: Once | INTRAMUSCULAR | Status: AC
  Administered 2023-07-25: 10 mg via INTRAVENOUS
  Filled 2023-07-25: qty 2

## 2023-07-25 MED ORDER — DEXAMETHASONE SODIUM PHOSPHATE 10 MG/ML IJ SOLN
10.0000 mg | Freq: Once | INTRAMUSCULAR | Status: AC
  Administered 2023-07-25: 10 mg via INTRAVENOUS
  Filled 2023-07-25: qty 1

## 2023-07-25 MED ORDER — DIPHENHYDRAMINE HCL 50 MG/ML IJ SOLN
12.5000 mg | Freq: Once | INTRAMUSCULAR | Status: AC
  Administered 2023-07-25: 12.5 mg via INTRAVENOUS
  Filled 2023-07-25: qty 1

## 2023-07-25 NOTE — Discharge Instructions (Addendum)
I have prescribed you for set for breakthrough headaches.  Take this medication as prescribed.  It is mildly sedating so do not mix with alcohol drugs or dangerous activities including driving.

## 2023-07-25 NOTE — ED Provider Notes (Signed)
[place] EMERGENCY DEPARTMENT AT [facility] Provider Note   CSN: [id number] Arrival date & time: 07/25/23  1017     History  Chief Complaint  Patient presents with   Headache    Sheila Yoder is a 58 y.o. female.  Patient here with headache.  History of headaches.  Worse the last couple days.  Nothing makes it worse or better.  She has been seen by neurology.  She denies any weakness numbness tingling.  No vision loss.  No speech issues.  Denies any other major issues.  Tylenol has not helped.  She is not on any headache medicine at this time.  She is not having any other complaints at this time.  History of hypertension anxiety depression.  The history is provided by the patient.       Home Medications Prior to Admission medications   Medication Sig Start Date End Date Taking? Authorizing Provider  butalbital-acetaminophen-caffeine (FIORICET) 50-325-40 MG tablet Take 1 tablet by mouth every 6 (six) hours as needed for up to 20 doses for headache or migraine. 07/25/23  Yes Aspyn Warnke, DO  acetaminophen (TYLENOL) 500 MG tablet Take 1,000 mg by mouth 2 (two) times daily as needed for headache.    [provider]  amLODipine (NORVASC) 10 MG tablet Take 10 mg by mouth daily.    [provider]  amLODipine (NORVASC) 5 MG tablet Take by mouth. 02/19/21   [provider]  amLODipine (NORVASC) 5 MG tablet TAKE 2 TABLETS (10 MG TOTAL) BY MOUTH DAILY. 11/02/19 11/01/20  Claiborne Rigg, NP  Ascorbic Acid (VITAMIN C WITH ROSE HIPS) 500 MG tablet Take 1,500 mg by mouth daily.    [provider]  ascorbic acid (VITAMIN C) 1000 MG tablet Take by mouth.    [provider]  atorvastatin (LIPITOR) 40 MG tablet TAKE 1 TABLET (40 MG TOTAL) BY MOUTH DAILY. 01/05/20 01/04/21  Claiborne Rigg, NP  cetirizine (ZYRTEC ALLERGY) 10 MG tablet Take 1 tablet (10 mg total) by mouth daily. 11/19/21 12/19/21  Tanda Rockers, PA-C  Cholecalciferol (DIALYVITE  VITAMIN D 5000 PO) Take 5,000 Units by mouth daily.    [provider]  diclofenac Sodium (VOLTAREN) 1 % GEL Apply 2 g topically 4 (four) times daily. Rub into affected area of foot 2 to 4 times daily 03/19/21   Vivi Barrack, DPM  escitalopram (LEXAPRO) 10 MG tablet  10/03/20   [provider]  famotidine (PEPCID) 20 MG tablet Take by mouth.    [provider]  fluticasone (FLONASE) 50 MCG/ACT nasal spray Place 2 sprays into both nostrils daily. 11/02/19   Claiborne Rigg, NP  fluticasone (FLONASE) 50 MCG/ACT nasal spray Place into the nose. 11/05/20   [provider]  fluticasone (FLONASE) 50 MCG/ACT nasal spray PLACE 2 SPRAYS INTO BOTH NOSTRILS DAILY. 11/02/19 11/01/20  Claiborne Rigg, NP  fluticasone (FLONASE) 50 MCG/ACT nasal spray Place 1 spray into both nostrils daily. 11/19/21   Tanda Rockers, PA-C  Garlic 1000 MG CAPS Take 1,000 mg by mouth daily.    [provider]  ibuprofen (ADVIL) 800 MG tablet Take 1 tablet (800 mg total) by mouth every 8 (eight) hours as needed. 05/10/21   Vivi Barrack, DPM  lansoprazole (PREVACID) 30 MG capsule Take 30 mg by mouth daily at 12 noon.    [provider]  lansoprazole (PREVACID) 30 MG capsule TAKE 1 CAPSULE (30 MG TOTAL) BY MOUTH 2 (TWO) TIMES DAILY BEFORE  A MEAL. 11/02/19 11/01/20  Claiborne Rigg, NP  levalbuterol [facility] HFA) 45 MCG/ACT inhaler SMARTSIG:1-2 Puff(s) Via Inhaler Every 4 Hours PRN 01/07/21   [provider]  loratadine (CLARITIN) 10 MG tablet Take by mouth.    [provider]  Multiple Vitamin (MULTIVITAMIN) tablet Take 1 tablet by mouth daily.    [provider]  omeprazole (PRILOSEC) 40 MG capsule TAKE 1 CAPSULE (40 MG TOTAL) BY MOUTH DAILY. 02/03/20 02/02/21  Hoy Register, MD  oxyCODONE (ROXICODONE) 5 MG immediate release tablet Take 1 tablet (5 mg total) by mouth every 4 (four) hours as needed for severe pain. Patient not taking: Reported on 08/28/2020  04/26/20   Dione Booze, MD  pantoprazole (PROTONIX) 40 MG tablet Take 40 mg by mouth daily. 02/11/21   [provider]  Probiotic Product (PROBIOTIC PO) Take 1 capsule by mouth daily.    [provider]  prochlorperazine (COMPAZINE) 10 MG tablet Take 1 tablet (10 mg total) by mouth every 6 (six) hours as needed for nausea or vomiting. Patient not taking: Reported on 08/28/2020 04/26/20   Dione Booze, MD  Zinc 50 MG TABS Take 50 mg by mouth daily.    [provider]  buPROPion (WELLBUTRIN XL) 150 MG 24 hr tablet Take 1 tablet (150 mg total) by mouth daily. 11/02/19 04/26/20  Claiborne Rigg, NP  esomeprazole (NEXIUM) 40 MG capsule Take 1 capsule (40 mg total) by mouth daily at 12 noon. 01/05/19 04/02/19  Claiborne Rigg, NP  misoprostol (CYTOTEC) 200 MCG tablet Take 3 pills by mouth the night before biopsy. Patient not taking: Reported on 11/02/2019 09/01/19 04/26/20  Allie Bossier, MD  pravastatin (PRAVACHOL) 40 MG tablet Take 1 tablet (40 mg total) by mouth daily. Patient not taking: Reported on 04/25/2020 01/09/20 04/26/20  Claiborne Rigg, NP      Allergies    Atorvastatin, Latex, Rosuvastatin, Bactrim [sulfamethoxazole-trimethoprim], and Other    Review of Systems   Review of Systems  Physical Exam Updated Vital Signs BP 135/71   Pulse 88   Temp 97.7 F (36.5 C) (Oral)   Resp (!) 24   Ht 5\' 6"  (1.676 m)   Wt (!) 141.1 kg   SpO2 100%   BMI 50.20 kg/m  Physical Exam Vitals and nursing note reviewed.  Constitutional:      General: She is not in acute distress.    Appearance: She is well-developed. She is not ill-appearing.  HENT:     Head: Normocephalic and atraumatic.     Mouth/Throat:     Mouth: Mucous membranes are moist.     Pharynx: Oropharynx is clear.  Eyes:     General: No visual field deficit.    Extraocular Movements: Extraocular movements intact.     Right eye: Normal extraocular motion and no nystagmus.     Left eye: Normal extraocular  motion and no nystagmus.     Conjunctiva/sclera: Conjunctivae normal.     Pupils: Pupils are equal, round, and reactive to light.  Cardiovascular:     Rate and Rhythm: Normal rate and regular rhythm.     Heart sounds: No murmur heard. Pulmonary:     Effort: Pulmonary effort is normal. No respiratory distress.     Breath sounds: Normal breath sounds.  Abdominal:     Palpations: Abdomen is soft.     Tenderness: There is no abdominal tenderness.  Musculoskeletal:        General: No swelling.     Cervical  back: Normal range of motion and neck supple.  Skin:    General: Skin is warm and dry.     Capillary Refill: Capillary refill takes less than 2 seconds.  Neurological:     Mental Status: She is alert and oriented to person, place, and time.     Cranial Nerves: No cranial nerve deficit, dysarthria or facial asymmetry.     Sensory: No sensory deficit.     Motor: No weakness.     Coordination: Coordination normal.     Gait: Gait normal.  Psychiatric:        Mood and Affect: Mood normal.     ED Results / Procedures / Treatments   Labs (all labs ordered are listed, but only abnormal results are displayed) Labs Reviewed  CBC WITH DIFFERENTIAL/PLATELET - Abnormal; Notable for the following components:      Result Value   RBC 3.67 (*)    Hemoglobin 11.3 (*)    HCT 35.6 (*)    All other components within normal limits  BASIC METABOLIC PANEL - Abnormal; Notable for the following components:   Glucose, Bld 101 (*)    All other components within normal limits    EKG None  Radiology CT HEAD WO CONTRAST ( ) Result Date: 07/25/2023 CLINICAL DATA:  Headache, increasing frequency or severity EXAM: CT HEAD WITHOUT CONTRAST TECHNIQUE: Contiguous axial images were obtained from the base of the skull through the vertex without intravenous contrast. RADIATION DOSE REDUCTION: This exam was performed according to the departmental dose-optimization program which includes automated exposure  control, adjustment of the mA and/or kV according to patient size and/or use of iterative reconstruction technique. COMPARISON:  CT head Nov 19, 2021. FINDINGS: Brain: No evidence of acute infarction, hemorrhage, hydrocephalus, extra-axial collection or mass lesion/mass effect. Partially empty sella. Vascular: No hyperdense vessel. Skull: No acute fracture. Sinuses/Orbits: Clear sinuses.  No acute orbital findings. Other: Muscle. IMPRESSION: 1. No evidence of acute intracranial abnormality. 2. Partially empty sella, which is often a normal anatomic variant but can be associated with idiopathic intracranial hypertension. Electronically Signed   By: Feliberto Harts M.D.   On: 07/25/2023 11:42    Procedures Procedures    Medications Ordered in ED Medications  dexamethasone (DECADRON) injection 10 mg (has no administration in time range)  prochlorperazine (COMPAZINE) injection 10 mg (10 mg Intravenous Given 07/25/23 1112)  diphenhydrAMINE (BENADRYL) injection 12.5 mg (12.5 mg Intravenous Given 07/25/23 1111)    ED Course/ Medical Decision Making/ A&P                                 Medical Decision Making Amount and/or Complexity of Data Reviewed Labs: ordered. Radiology: ordered.  Risk Prescription drug management.   Aicha Clingenpeel is here with headaches.  She has been dealing with migraine headaches for a long time.  Follows with neurology but she is not on any medication currently for headaches.  She denies any weakness numbness tingling.  Denies any chest pain shortness of breath.  No vision loss.  Her neurological exam is normal.  Blood pressure is unremarkable.  No cold or cough symptoms.  Will give headache cocktail with Compazine Benadryl Decadron and get basic labs and CT scan of her head.  She has not had a CT scan in a while.  She scheduled for an MRI outpatient.  However I do not think she needs any emergent MRI at this time.  Frontal diagnosis  likely migraine headache and I have no  concern for meningitis stroke or other acute neurologic process.  Per my review and interpretation labs no significant anemia or electrolyte abnormality kidney injury.  Head CT is unremarkable per radiology report.  I reviewed and interpreted labs and imaging as well.  She has no visual changes and I have no concern for pseudotumor cerebri at this time or any need for any further emergent workup for that.  I will prescribe her Fioricet and have her follow-up with neurology.  Discharged in good condition.  This chart was dictated using voice recognition software.  Despite best efforts to proofread,  errors can occur which can change the documentation meaning.         Final Clinical Impression(s) / ED Diagnoses Final diagnoses:  Other migraine without status migrainosus, not intractable    Rx / DC Orders ED Discharge Orders          Ordered    butalbital-acetaminophen-caffeine (FIORICET) 50-325-40 MG tablet  Every 6 hours PRN        07/25/23 1307              Virgina Norfolk, DO 07/25/23 1307

## 2023-07-25 NOTE — ED Triage Notes (Signed)
Patient sent to ED by urgent care For BP 152/79 Dizziness, headaches x 2 days Denies weakness, blurred vision, slurred speech Says left inner leg having pain on and off for several days BP in triage 126/99 Bilateral grip strength equal No deviation / drooping of facial features/tongue

## 2023-08-10 ENCOUNTER — Other Ambulatory Visit: Payer: Self-pay

## 2023-08-10 ENCOUNTER — Encounter: Payer: Self-pay | Admitting: Emergency Medicine

## 2023-08-10 ENCOUNTER — Ambulatory Visit
Admission: EM | Admit: 2023-08-10 | Discharge: 2023-08-10 | Disposition: A | Discharge status: Home / Self Care | Payer: Medicare Other | Attending: Emergency Medicine | Admitting: Emergency Medicine

## 2023-08-10 DIAGNOSIS — J309 Allergic rhinitis, unspecified: Secondary | ICD-10-CM

## 2023-08-10 DIAGNOSIS — H9203 Otalgia, bilateral: Secondary | ICD-10-CM | POA: Diagnosis not present

## 2023-08-10 MED ORDER — MOMETASONE FUROATE 50 MCG/ACT NA SUSP: 2.0000 | Freq: Every day | NASAL | 2 refills | Status: DC

## 2023-08-10 MED ORDER — PSEUDOEPHEDRINE HCL 30 MG PO TABS: 60.0000 mg | ORAL_TABLET | Freq: Three times a day (TID) | ORAL | 0 refills | Status: AC | PRN

## 2023-08-10 MED ORDER — LEVOCETIRIZINE DIHYDROCHLORIDE 5 MG PO TABS: 5.0000 mg | ORAL_TABLET | Freq: Every evening | ORAL | 1 refills | Status: AC

## 2023-08-10 MED ORDER — LEVOCETIRIZINE DIHYDROCHLORIDE 5 MG PO TABS: 5.0000 mg | ORAL_TABLET | Freq: Every evening | ORAL | 1 refills | Status: DC

## 2023-08-10 MED ORDER — PSEUDOEPHEDRINE HCL 30 MG PO TABS: 60.0000 mg | ORAL_TABLET | Freq: Three times a day (TID) | ORAL | 0 refills | Status: DC | PRN

## 2023-08-10 NOTE — Discharge Instructions (Addendum)
 Your symptoms and my physical exam findings are concerning for exacerbation of your underlying allergies.     Please read below to learn more about the medications, dosages and frequencies that I recommend to help alleviate your symptoms and to get you feeling better soon:   Xyzal  (levocetirizine): This is an excellent second-generation antihistamine that helps to reduce respiratory inflammatory response to environmental allergens.  In some patients, this medication can cause daytime sleepiness so I recommend that you take 1 tablet daily at bedtime.     Nasonex  (mometasone ): This is a steroid nasal spray that used once daily, 1 spray in each nare.  This works best when used on a daily basis. This medication does not work well if it is only used when you think you need it.  After 3 to 5 days of use, you will notice significant reduction of the inflammation and mucus production that is currently being caused by exposure to allergens, whether seasonal or environmental.  The most common side effect of this medication is nosebleeds.  If you experience a nosebleed, please discontinue use for 1 week, then feel free to resume.   If you find that your insurance will not pay for this medication, please consider a different nasal steroids such as Flonase  (fluticasone ), or Nasacort (triamcinolone).   Sudafed (pseudoephedrine ): This is a decongestant.  This single symptom reliever is a safe medication that reduces mucus production, runny nose, sinus drainage and chest congestion.  I recommend taking 2 tablets, 60 mg, 2-3 times a day.  If you find that 60 mg is too strong, you can certainly just take one tablet, 30 mg, 2 to 3 times a day.  Please keep in mind that while this medication is available over-the-counter, you will need to purchase it directly from the pharmacist due to known misuse of this drug for other purposes.  I do not recommend the extended relief version of this medication as it makes some patients feel  jittery or jumpy and can interfere with sleep.  If symptoms have not meaningfully improved in the next 5 to 7 days, please return for repeat evaluation or follow-up with your regular provider.  If symptoms have worsened in the next 3 to 5 days, please return for repeat evaluation or follow-up with your regular provider.    Thank you for visiting urgent care today.  We appreciate the opportunity to participate in your care.

## 2023-08-10 NOTE — ED Triage Notes (Signed)
 Bilateral ear pain 3-4 days ago.  Has used warm cloth to her ears.  Also has a slight runny nose, slight dry cough

## 2023-08-10 NOTE — ED Provider Notes (Signed)
 [facility]    CSN: [id number] Arrival date & time: 08/10/23  1040    HISTORY   Chief Complaint  Patient presents with   Otalgia   HPI Sheila Yoder is a pleasant, 58 y.o. female who presents to urgent care today. Patient complains of a 3 to 4-day history of bilateral ear pain.  Patient states she has been using a warm cloth on her ears without meaningful relief.  Patient also endorses slight runny nose, slight dry cough.  Patient reports a history of allergies, has tried cetirizine  and loratadine in the past but does not feel that they help very much so is not currently taking anything.  Per EMR, patient has also been prescribed Flonase , states she is not taking this either.  The history is provided by the patient.   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   Non-seasonal allergic rhinitis 05/11/2020   Prediabetes 05/02/2020   Essential hypertension 05/01/2020   Class 3 severe obesity with serious comorbidity and body mass index (BMI) of 50.0 to 59.9 in adult [facility]) 03/09/2020   Radial scar of left breast 03/09/2020   Nasal obstruction 02/02/2020   Deviated nasal septum 01/12/2020   History of colon polyps 01/12/2020   Laryngopharyngeal reflux (LPR) 10/11/2019   Colon cancer screening    Benign neoplasm of cecum    Benign neoplasm of ascending colon    Diarrhea    Dysphagia    Esophageal stricture    Gastroesophageal reflux disease    Plantar fasciitis of right foot 05/30/2019   Gastroesophageal reflux disease without esophagitis  07/15/2017   Peri-menopause 08/23/2014   Screening for STD (sexually transmitted disease) 08/23/2014   Past Surgical History:  Procedure Laterality Date   BALLOON DILATION N/A 08/15/2019   Procedure: BALLOON DILATION;  Surgeon: Tobin Forts, MD;  Location: [facility];  Service: Endoscopy;  Laterality: N/A;   BIOPSY  08/15/2019   Procedure: BIOPSY;  Surgeon: Tobin Forts, MD;  Location: [facility];  Service: Endoscopy;;  EGD and COLON   BREAST REDUCTION SURGERY     CESAREAN SECTION     CHOLECYSTECTOMY     COLONOSCOPY WITH PROPOFOL  N/A 08/15/2019   Procedure: COLONOSCOPY WITH PROPOFOL ;  Surgeon: Tobin Forts, MD;  Location: [facility];  Service: Endoscopy;  Laterality: N/A;   ESOPHAGEAL DILATION  2013   in [place]     ESOPHAGOGASTRODUODENOSCOPY (EGD) WITH PROPOFOL  N/A 08/15/2019   Procedure: ESOPHAGOGASTRODUODENOSCOPY (EGD) WITH PROPOFOL ;  Surgeon: Tobin Forts, MD;  Location: [facility];  Service: Endoscopy;  Laterality: N/A;   POLYPECTOMY  08/15/2019   Procedure: POLYPECTOMY;  Surgeon: Tobin Forts, MD;  Location: [facility];  Service: Endoscopy;;   REDUCTION MAMMAPLASTY Bilateral 2000   TUBAL LIGATION     OB History     Gravida  2   Para  2   Term  2   Preterm      AB  Living  2      SAB      IAB      Ectopic      Multiple      Live Births             Home Medications    Prior to Admission medications   Medication Sig Start Date End Date Taking? Authorizing Provider  acetaminophen (TYLENOL) 500 MG tablet Take 1,000 mg by mouth 2 (two) times daily as needed for headache.    [provider]  amLODipine  (NORVASC ) 10 MG tablet Take 10 mg by mouth daily.    [provider]  amLODipine  (NORVASC ) 5 MG tablet Take by mouth. 02/19/21   [provider]  amLODipine  (NORVASC ) 5 MG tablet TAKE 2 TABLETS (10 MG TOTAL) BY MOUTH DAILY. 11/02/19 11/01/20  Fleming, Zelda W, NP  Ascorbic Acid (VITAMIN C WITH ROSE HIPS) 500 MG tablet  Take 1,500 mg by mouth daily.    [provider]  ascorbic acid (VITAMIN C) 1000 MG tablet Take by mouth.    [provider]  atorvastatin  (LIPITOR) 40 MG tablet TAKE 1 TABLET (40 MG TOTAL) BY MOUTH DAILY. 01/05/20 01/04/21  Fleming, Zelda W, NP  butalbital -acetaminophen-caffeine  (FIORICET) 50-325-40 MG tablet Take 1 tablet by mouth every 6 (six) hours as needed for up to 20 doses for headache or migraine. Patient not taking: Reported on 08/10/2023 07/25/23   Lowery Rue, DO  cetirizine  (ZYRTEC  ALLERGY) 10 MG tablet Take 1 tablet (10 mg total) by mouth daily. Patient not taking: Reported on 08/10/2023 11/19/21 12/19/21  Venter, Margaux, PA-C  Cholecalciferol (DIALYVITE VITAMIN D  5000 PO) Take 5,000 Units by mouth daily.    [provider]  diclofenac  Sodium (VOLTAREN ) 1 % GEL Apply 2 g topically 4 (four) times daily. Rub into affected area of foot 2 to 4 times daily 03/19/21   Charity Conch, DPM  escitalopram (LEXAPRO) 10 MG tablet  10/03/20   [provider]  famotidine  (PEPCID ) 20 MG tablet Take by mouth. Patient not taking: Reported on 08/10/2023    [provider]  fluticasone  (FLONASE ) 50 MCG/ACT nasal spray Place 2 sprays into both nostrils daily. Patient not taking: Reported on 08/10/2023 11/02/19   Fleming, Zelda W, NP  fluticasone  (FLONASE ) 50 MCG/ACT nasal spray Place into the nose. Patient not taking: Reported on 08/10/2023 11/05/20   [provider]  fluticasone  (FLONASE ) 50 MCG/ACT nasal spray PLACE 2 SPRAYS INTO BOTH NOSTRILS DAILY. Patient not taking: Reported on 08/10/2023 11/02/19 11/01/20  Fleming, Zelda W, NP  fluticasone  (FLONASE ) 50 MCG/ACT nasal spray Place 1 spray into both nostrils daily. Patient not taking: Reported on 08/10/2023 11/19/21   Venter, Margaux, PA-C  Garlic 1000 MG CAPS Take 1,000 mg by mouth daily.    [provider]  ibuprofen  (ADVIL ) 800 MG tablet Take 1 tablet (800 mg total) by mouth every 8 (eight) hours as  needed. 05/10/21   Charity Conch, DPM  lansoprazole  (PREVACID ) 30 MG capsule Take 30 mg by mouth daily at 12 noon. Patient not taking: Reported on 08/10/2023    [provider]  lansoprazole  (PREVACID ) 30 MG capsule TAKE 1 CAPSULE (30 MG TOTAL) BY MOUTH 2 (TWO) TIMES DAILY BEFORE A MEAL. Patient not taking: Reported on 08/10/2023 11/02/19 11/01/20  Fleming, Zelda W, NP  levalbuterol (XOPENEX [facility]) 45 MCG/ACT inhaler SMARTSIG:1-2 Puff(s) Via Inhaler Every 4 Hours PRN Patient not taking: Reported on 08/10/2023 01/07/21   [provider]  loratadine (CLARITIN)  10 MG tablet Take by mouth. Patient not taking: Reported on 08/10/2023    [provider]  Multiple Vitamin (MULTIVITAMIN) tablet Take 1 tablet by mouth daily.    [provider]  omeprazole  (PRILOSEC) 40 MG capsule TAKE 1 CAPSULE (40 MG TOTAL) BY MOUTH DAILY. Patient not taking: Reported on 08/10/2023 02/03/20 02/02/21  Newlin, Enobong, MD  oxyCODONE  (ROXICODONE ) 5 MG immediate release tablet Take 1 tablet (5 mg total) by mouth every 4 (four) hours as needed for severe pain. Patient not taking: Reported on 08/28/2020 04/26/20   Alissa April, MD  pantoprazole  (PROTONIX ) 40 MG tablet Take 40 mg by mouth daily. 02/11/21   [provider]  Probiotic Product (PROBIOTIC PO) Take 1 capsule by mouth daily.    [provider]  prochlorperazine  (COMPAZINE ) 10 MG tablet Take 1 tablet (10 mg total) by mouth every 6 (six) hours as needed for nausea or vomiting. Patient not taking: Reported on 08/28/2020 04/26/20   Alissa April, MD  Zinc 50 MG TABS Take 50 mg by mouth daily.    [provider]  buPROPion  (WELLBUTRIN  XL) 150 MG 24 hr tablet Take 1 tablet (150 mg total) by mouth daily. 11/02/19 04/26/20  Fleming, Zelda W, NP  esomeprazole  (NEXIUM ) 40 MG capsule Take 1 capsule (40 mg total) by mouth daily at 12 noon. 01/05/19 04/02/19  Fleming, Zelda W, NP  misoprostol  (CYTOTEC ) 200 MCG tablet Take 3 pills by mouth  the night before biopsy. Patient not taking: Reported on 11/02/2019 09/01/19 04/26/20  Dove, Myra C, MD  pravastatin  (PRAVACHOL ) 40 MG tablet Take 1 tablet (40 mg total) by mouth daily. Patient not taking: Reported on 04/25/2020 01/09/20 04/26/20  Collins Dean, NP    Family History Family History  Problem Relation Age of Onset   Hypertension Mother    Diabetes Mother    Arthritis Mother        rheumatoid   Hyperlipidemia Mother    Hypertension Sister    Diabetes Sister    Hypertension Sister    Diabetes Sister    Hypertension Sister    Hypertension Sister    Hypertension Sister    Lupus Sister    Other Brother        hunting accident   Other Brother        fell off building   Diabetes Brother    Hypertension Brother    Hypertension Brother    Other Daughter        crohn's disease   Diabetes Other        maternal nephew   Stomach cancer Neg Hx    Esophageal cancer Neg Hx    Pancreatic cancer Neg Hx    Colon cancer Neg Hx    Rectal cancer Neg Hx    Colon polyps Neg Hx    Social History Social History   Tobacco Use   Smoking status: Never   Smokeless tobacco: Never  Vaping Use   Vaping status: Never Used  Substance Use Topics   Alcohol use: No   Drug use: No   Allergies   Atorvastatin , Latex, Rosuvastatin, Bactrim [sulfamethoxazole-trimethoprim], and Other  Review of Systems Review of Systems Pertinent findings revealed after performing a 14 point review of systems has been noted in the history of present illness.  Physical Exam Vital Signs BP (!) 142/83 (BP Location: Right Arm) Comment (BP Location): large cuff on forearm  Pulse 79   Temp 98.3 F (36.8 C) (Oral)   Resp 20  SpO2 94%   No data found.  Physical Exam Vitals and nursing note reviewed.  Constitutional:      General: She is awake. She is not in acute distress.    Appearance: Normal appearance. She is well-developed and well-groomed. She is morbidly obese. She is not ill-appearing.   HENT:     Head: Normocephalic and atraumatic.     Salivary Glands: Right salivary gland is not diffusely enlarged or tender. Left salivary gland is not diffusely enlarged or tender.     Right Ear: Ear canal and external ear normal. No drainage. A middle ear effusion is present. There is no impacted cerumen. Tympanic membrane is bulging. Tympanic membrane is not injected or erythematous.     Left Ear: Ear canal and external ear normal. No drainage. A middle ear effusion is present. There is no impacted cerumen. Tympanic membrane is bulging. Tympanic membrane is not injected or erythematous.     Ears:     Comments: Bilateral EACs normal, both TMs bulging with clear fluid    Nose: Rhinorrhea present. No nasal deformity, septal deviation, signs of injury, nasal tenderness, mucosal edema or congestion. Rhinorrhea is clear.     Right Nostril: Occlusion present. No foreign body, epistaxis or septal hematoma.     Left Nostril: Occlusion present. No foreign body, epistaxis or septal hematoma.     Right Turbinates: Enlarged, swollen and pale.     Left Turbinates: Enlarged, swollen and pale.     Right Sinus: No maxillary sinus tenderness or frontal sinus tenderness.     Left Sinus: No maxillary sinus tenderness or frontal sinus tenderness.     Mouth/Throat:     Lips: Pink. No lesions.     Mouth: Mucous membranes are moist. No oral lesions.     Pharynx: Oropharynx is clear. Uvula midline. No posterior oropharyngeal erythema or uvula swelling.     Tonsils: No tonsillar exudate. 0 on the right. 0 on the left.     Comments: Postnasal drip Eyes:     General: Lids are normal.        Right eye: No discharge.        Left eye: No discharge.     Extraocular Movements: Extraocular movements intact.     Conjunctiva/sclera: Conjunctivae normal.     Right eye: Right conjunctiva is not injected.     Left eye: Left conjunctiva is not injected.  Neck:     Trachea: Trachea and phonation normal.  Cardiovascular:      Rate and Rhythm: Normal rate and regular rhythm.     Pulses: Normal pulses.     Heart sounds: Normal heart sounds. No murmur heard.    No friction rub. No gallop.  Pulmonary:     Effort: Pulmonary effort is normal. No accessory muscle usage, prolonged expiration or respiratory distress.     Breath sounds: Normal breath sounds. No stridor, decreased air movement or transmitted upper airway sounds. No decreased breath sounds, wheezing, rhonchi or rales.  Chest:     Chest wall: No tenderness.  Musculoskeletal:        General: Normal range of motion.     Cervical back: Normal range of motion and neck supple. Normal range of motion.  Lymphadenopathy:     Cervical: No cervical adenopathy.  Skin:    General: Skin is warm and dry.     Findings: No erythema or rash.  Neurological:     General: No focal deficit present.     Mental Status:  She is alert and oriented to person, place, and time.  Psychiatric:        Mood and Affect: Mood normal.        Behavior: Behavior normal. Behavior is cooperative.     Visual Acuity Right Eye Distance:   Left Eye Distance:   Bilateral Distance:    Right Eye Near:   Left Eye Near:    Bilateral Near:     UC Couse / Diagnostics / Procedures:     Radiology No results found.  Procedures Procedures (including critical care time) EKG  Pending results:  Labs Reviewed - No data to display  Medications Ordered in UC: Medications - No data to display  UC Diagnoses / Final Clinical Impressions(s)   I have reviewed the triage vital signs and the nursing notes.  Pertinent labs & imaging results that were available during my care of the patient were reviewed by me and considered in my medical decision making (see chart for details).    Final diagnoses:  Allergic rhinitis, unspecified seasonality, unspecified trigger  Otalgia of both ears   Patient advised physical exam findings concerning for uncontrolled allergies and that her otalgia is due  to both of her eardrums bulging with clear fluid.  Patient provided with prescriptions for levocetirizine and Nasonex  to see if these worked little bit better than the allergy medications that she started in the past.  Also recommend short-term use of Sudafed to help dry up nasal pasages and fluid in inner ears.  Conservative care recommended.  Return precautions advised.  Please see discharge instructions below for details of plan of care as provided to patient. ED Prescriptions     Medication Sig Dispense Auth. Provider   levocetirizine (XYZAL ) 5 MG tablet Take 1 tablet (5 mg total) by mouth every evening. 90 tablet Eloise Hake Scales, PA-C   mometasone  (NASONEX ) 50 MCG/ACT nasal spray Place 2 sprays into the nose daily. 1 each Eloise Hake Scales, PA-C   pseudoephedrine  (SUDAFED) 30 MG tablet Take 2 tablets (60 mg total) by mouth every 8 (eight) hours as needed for up to 3 days for congestion. 18 tablet Eloise Hake Scales, PA-C      PDMP not reviewed this encounter.  Pending results:  Labs Reviewed - No data to display    Discharge Instructions      Your symptoms and my physical exam findings are concerning for exacerbation of your underlying allergies.     Please read below to learn more about the medications, dosages and frequencies that I recommend to help alleviate your symptoms and to get you feeling better soon:   Xyzal  (levocetirizine): This is an excellent second-generation antihistamine that helps to reduce respiratory inflammatory response to environmental allergens.  In some patients, this medication can cause daytime sleepiness so I recommend that you take 1 tablet daily at bedtime.     Nasonex  (mometasone ): This is a steroid nasal spray that used once daily, 1 spray in each nare.  This works best when used on a daily basis. This medication does not work well if it is only used when you think you need it.  After 3 to 5 days of use, you will notice significant  reduction of the inflammation and mucus production that is currently being caused by exposure to allergens, whether seasonal or environmental.  The most common side effect of this medication is nosebleeds.  If you experience a nosebleed, please discontinue use for 1 week, then feel free to resume.  If you find that your insurance will not pay for this medication, please consider a different nasal steroids such as Flonase  (fluticasone ), or Nasacort (triamcinolone).   Sudafed (pseudoephedrine ): This is a decongestant.  This single symptom reliever is a safe medication that reduces mucus production, runny nose, sinus drainage and chest congestion.  I recommend taking 2 tablets, 60 mg, 2-3 times a day.  If you find that 60 mg is too strong, you can certainly just take one tablet, 30 mg, 2 to 3 times a day.  Please keep in mind that while this medication is available over-the-counter, you will need to purchase it directly from the pharmacist due to known misuse of this drug for other purposes.  I do not recommend the extended relief version of this medication as it makes some patients feel jittery or jumpy and can interfere with sleep.  If symptoms have not meaningfully improved in the next 5 to 7 days, please return for repeat evaluation or follow-up with your regular provider.  If symptoms have worsened in the next 3 to 5 days, please return for repeat evaluation or follow-up with your regular provider.    Thank you for visiting urgent care today.  We appreciate the opportunity to participate in your care.       Disposition Upon Discharge:  Condition: stable for discharge home  Patient presented with an acute illness with associated systemic symptoms and significant discomfort requiring urgent management. In my opinion, this is a condition that a prudent lay person (someone who possesses an average knowledge of health and medicine) may potentially expect to result in complications if not addressed  urgently such as respiratory distress, impairment of bodily function or dysfunction of bodily organs.   Routine symptom specific, illness specific and/or disease specific instructions were discussed with the patient and/or caregiver at length.   As such, the patient has been evaluated and assessed, work-up was performed and treatment was provided in alignment with urgent care protocols and evidence based medicine.  Patient/parent/caregiver has been advised that the patient may require follow up for further testing and treatment if the symptoms continue in spite of treatment, as clinically indicated and appropriate.  Patient/parent/caregiver has been advised to return to the [facility] or PCP if no better; to PCP or the Emergency Department if new signs and symptoms develop, or if the current signs or symptoms continue to change or worsen for further workup, evaluation and treatment as clinically indicated and appropriate  The patient will follow up with their current PCP if and as advised. If the patient does not currently have a PCP we will assist them in obtaining one.   The patient may need specialty follow up if the symptoms continue, in spite of conservative treatment and management, for further workup, evaluation, consultation and treatment as clinically indicated and appropriate.  Patient/parent/caregiver verbalized understanding and agreement of plan as discussed.  All questions were addressed during visit.  Please see discharge instructions below for further details of plan.  This office note has been dictated using Teaching laboratory technician.  Unfortunately, this method of dictation can sometimes lead to typographical or grammatical errors.  I apologize for your inconvenience in advance if this occurs.  Please do not hesitate to reach out to me if clarification is needed.      Eloise Hake Scales, PA-C 08/10/23 1231

## 2023-10-05 ENCOUNTER — Other Ambulatory Visit: Payer: Self-pay

## 2024-02-29 ENCOUNTER — Other Ambulatory Visit: Payer: Self-pay

## 2024-02-29 ENCOUNTER — Ambulatory Visit
Admission: EM | Admit: 2024-02-29 | Discharge: 2024-02-29 | Disposition: A | Discharge status: Home / Self Care | Attending: Internal Medicine | Admitting: Internal Medicine

## 2024-02-29 DIAGNOSIS — H66001 Acute suppurative otitis media without spontaneous rupture of ear drum, right ear: Secondary | ICD-10-CM | POA: Diagnosis not present

## 2024-02-29 DIAGNOSIS — R0981 Nasal congestion: Secondary | ICD-10-CM | POA: Diagnosis not present

## 2024-02-29 LAB — POC COVID19/FLU A&B COMBO
Covid Antigen, POC: NEGATIVE
Influenza A Antigen, POC: NEGATIVE
Influenza B Antigen, POC: NEGATIVE

## 2024-02-29 MED ORDER — FLUTICASONE PROPIONATE 50 MCG/ACT NA SUSP: 1.0000 | Freq: Every day | NASAL | 0 refills | Status: DC | PRN

## 2024-02-29 MED ORDER — AMOXICILLIN 875 MG PO TABS: 875.0000 mg | ORAL_TABLET | Freq: Two times a day (BID) | ORAL | 0 refills | Status: DC

## 2024-02-29 NOTE — Discharge Instructions (Signed)
 Your COVID and flu tests were negative. A prescription was sent for Amoxicillin . This is an antibiotic used to treat ear infections and upper respiratory infections. Take as directed.   Return in 2 to 3 days if no improvement. It is very important for you to pay attention to any new symptoms or worsening of your current condition.  Please go directly to the Emergency Department immediately should you begin to have any of the following symptoms: shortness of breath, chest pain or difficulty breathing.

## 2024-02-29 NOTE — ED Provider Notes (Signed)
 [facility]  Note:  This document was prepared using Dragon voice recognition software and may include unintentional dictation errors.  MRN: [id number] DOB: May 02, 1966 DATE: 02/29/24   Subjective:  Chief Complaint:  Chief Complaint  Patient presents with   Cough     HPI: Sheila Yoder is a 58 y.o. female presenting for nasal congestion and sneezing for the past 3 days. Reports that 3 days ago she started with sneezing and rhinorrhea. She is concerned about COVID given she felt similar when she had COVID previously. Reports some chest congestion and post nasal drip. Son with similar symptoms. She reports taking tylenol and ibuprofen  with littler relief. She reports bilateral otalgia as well as headache. Denies fever, nausea/vomiting, abdominal pain, cough, SOB. Endorses rhinorrhea, congestion, otalgia, sore throat, headache. Presents NAD.  Prior to Admission medications   Medication Sig Start Date End Date Taking? Authorizing Provider  acetaminophen (TYLENOL) 500 MG tablet Take 1,000 mg by mouth 2 (two) times daily as needed for headache.    [provider]  amLODipine  (NORVASC ) 5 MG tablet Take by mouth. 02/19/21   [provider]  amLODipine  (NORVASC ) 5 MG tablet TAKE 2 TABLETS (10 MG TOTAL) BY MOUTH DAILY. 11/02/19 11/01/20  Fleming, Zelda W, NP  Ascorbic Acid (VITAMIN C WITH ROSE HIPS) 500 MG tablet Take 1,500 mg by mouth daily.    [provider]  ascorbic acid (VITAMIN C) 1000 MG tablet Take by mouth.    [provider]  atorvastatin  (LIPITOR) 40 MG tablet TAKE 1 TABLET (40 MG TOTAL) BY MOUTH DAILY. 01/05/20 01/04/21  Fleming, Zelda W, NP  Cholecalciferol (DIALYVITE VITAMIN D  5000 PO) Take 5,000 Units by mouth daily.    [provider]  Garlic 1000 MG CAPS Take 1,000 mg by mouth daily.    [provider]  ibuprofen  (ADVIL ) 800 MG tablet Take 1 tablet (800 mg total) by mouth every 8 (eight) hours as needed. 05/10/21   Gershon Donnice SAUNDERS,  DPM  levocetirizine (XYZAL ) 5 MG tablet Take 1 tablet (5 mg total) by mouth every evening. 08/10/23 02/06/24  Joesph Shaver Scales, PA-C  mometasone  (NASONEX ) 50 MCG/ACT nasal spray Place 2 sprays into the nose daily. 08/10/23 11/08/23  Joesph Shaver Scales, PA-C  Multiple Vitamin (MULTIVITAMIN) tablet Take 1 tablet by mouth daily.    [provider]  pantoprazole  (PROTONIX ) 40 MG tablet Take 40 mg by mouth daily. 02/11/21   [provider]  Probiotic Product (PROBIOTIC PO) Take 1 capsule by mouth daily.    [provider]  Zinc 50 MG TABS Take 50 mg by mouth daily.    [provider]  buPROPion  (WELLBUTRIN  XL) 150 MG 24 hr tablet Take 1 tablet (150 mg total) by mouth daily. 11/02/19 04/26/20  Fleming, Zelda W, NP  esomeprazole  (NEXIUM ) 40 MG capsule Take 1 capsule (40 mg total) by mouth daily at 12 noon. 01/05/19 04/02/19  Fleming, Zelda W, NP  misoprostol  (CYTOTEC ) 200 MCG tablet Take 3 pills by mouth the night before biopsy. Patient not taking: Reported on 11/02/2019 09/01/19 04/26/20  Dove, Myra C, MD  pravastatin  (PRAVACHOL ) 40 MG tablet Take 1 tablet (40 mg total) by mouth daily. Patient not taking: Reported on 04/25/2020 01/09/20 04/26/20  Fleming, Zelda W, NP     Allergies  Allergen Reactions   Atorvastatin  Other (See Comments)    myalgia myalgia    Latex Rash   Rosuvastatin Hives   Bactrim [Sulfamethoxazole-Trimethoprim] Other (See Comments)    Chest pain  Other Other (See Comments)    Sneezing    History:   Past Medical History:  Diagnosis Date   Anemia    Anxiety    self reported   Depression    self reported   GERD (gastroesophageal reflux disease)    Hemorrhoids    History of Helicobacter pylori infection    per patient report   Hypertension    Insomnia    Obesity    Peri-menopause 08/23/2014   Screening for STD (sexually transmitted disease) 08/23/2014     Past Surgical History:  Procedure Laterality Date   BALLOON DILATION N/A  08/15/2019   Procedure: BALLOON DILATION;  Surgeon: Abran Norleen SAILOR, MD;  Location: [facility];  Service: Endoscopy;  Laterality: N/A;   BIOPSY  08/15/2019   Procedure: BIOPSY;  Surgeon: Abran Norleen SAILOR, MD;  Location: [facility];  Service: Endoscopy;;  EGD and COLON   BREAST REDUCTION SURGERY     CESAREAN SECTION     CHOLECYSTECTOMY     COLONOSCOPY WITH PROPOFOL  N/A 08/15/2019   Procedure: COLONOSCOPY WITH PROPOFOL ;  Surgeon: Abran Norleen SAILOR, MD;  Location: [facility];  Service: Endoscopy;  Laterality: N/A;   ESOPHAGEAL DILATION  2013   in [place]     ESOPHAGOGASTRODUODENOSCOPY (EGD) WITH PROPOFOL  N/A 08/15/2019   Procedure: ESOPHAGOGASTRODUODENOSCOPY (EGD) WITH PROPOFOL ;  Surgeon: Abran Norleen SAILOR, MD;  Location: [facility];  Service: Endoscopy;  Laterality: N/A;   POLYPECTOMY  08/15/2019   Procedure: POLYPECTOMY;  Surgeon: Abran Norleen SAILOR, MD;  Location: [facility];  Service: Endoscopy;;   REDUCTION MAMMAPLASTY Bilateral 2000   TUBAL LIGATION      Family History  Problem Relation Age of Onset   Hypertension Mother    Diabetes Mother    Arthritis Mother        rheumatoid   Hyperlipidemia Mother    Hypertension Sister    Diabetes Sister    Hypertension Sister    Diabetes Sister    Hypertension Sister    Hypertension Sister    Hypertension Sister    Lupus Sister    Other Brother        hunting accident   Other Brother        fell off building   Diabetes Brother    Hypertension Brother    Hypertension Brother    Other Daughter        crohn's disease   Diabetes Other        maternal nephew   Stomach cancer Neg Hx    Esophageal cancer Neg Hx    Pancreatic cancer Neg Hx    Colon cancer Neg Hx    Rectal cancer Neg Hx    Colon polyps Neg Hx     Social History   Tobacco Use   Smoking status: Never   Smokeless tobacco: Never  Vaping Use   Vaping status: Never Used  Substance Use Topics   Alcohol use: No   Drug use: No    Review of Systems  Constitutional:   Negative for fever.  HENT:  Positive for congestion, ear pain, postnasal drip, rhinorrhea, sneezing and sore throat.   Respiratory:  Negative for cough and shortness of breath.   Gastrointestinal:  Negative for abdominal pain, nausea and vomiting.  Neurological:  Positive for headaches.     Objective:   Vitals: BP (!) 168/94 (BP Location: Right Wrist)   Pulse 80   Temp 98.6 F (37 C) (Oral)   Resp 16   LMP  (LMP Unknown)  SpO2 95%   Physical Exam Constitutional:      General: She is not in acute distress.    Appearance: Normal appearance. She is well-developed. She is morbidly obese. She is not ill-appearing or toxic-appearing.  HENT:     Head: Normocephalic and atraumatic.     Right Ear: Ear canal normal. A middle ear effusion is present. Tympanic membrane is erythematous and bulging.     Left Ear: Ear canal normal. A middle ear effusion is present.     Ears:     Comments: Right TM slightly erythematous with opacified, discolored fluid noted behind.    Nose: Rhinorrhea present. Rhinorrhea is clear.     Right Turbinates: Swollen.     Left Turbinates: Swollen.     Mouth/Throat:     Pharynx: Oropharynx is clear. Uvula midline. No pharyngeal swelling, oropharyngeal exudate or posterior oropharyngeal erythema.     Tonsils: No tonsillar exudate or tonsillar abscesses.  Cardiovascular:     Rate and Rhythm: Normal rate and regular rhythm.     Heart sounds: Normal heart sounds.  Pulmonary:     Effort: Pulmonary effort is normal.     Breath sounds: Normal breath sounds.     Comments: Clear to auscultation bilaterally  Abdominal:     General: Bowel sounds are normal.     Palpations: Abdomen is soft.     Tenderness: There is no abdominal tenderness.  Lymphadenopathy:     Cervical: No cervical adenopathy.     Right cervical: No superficial cervical adenopathy.    Left cervical: No superficial cervical adenopathy.  Skin:    General: Skin is warm and dry.  Neurological:      General: No focal deficit present.     Mental Status: She is alert.  Psychiatric:        Mood and Affect: Mood and affect normal.     Results:  Labs: Results for orders placed or performed during the hospital encounter of 02/29/24 (from the past 24 hours)  POC Covid19/Flu A&B Antigen     Status: None   Collection Time: 02/29/24  9:28 AM  Result Value Ref Range   Influenza A Antigen, POC Negative Negative   Influenza B Antigen, POC Negative Negative   Covid Antigen, POC Negative Negative    Radiology: No results found.   UC Course/Treatments:  Procedures: Procedures   Medications Ordered in UC: Medications - No data to display   Assessment and Plan :     ICD-10-CM   1. Non-recurrent acute suppurative otitis media of right ear without spontaneous rupture of tympanic membrane  H66.001     2. Nasal congestion  R09.81 POC Covid19/Flu A&B Antigen    POC Covid19/Flu A&B Antigen     Non-recurrent acute suppurative otitis media of right ear without spontaneous rupture of tympanic membrane Afebrile, nontoxic-appearing, NAD. VSS. DDX includes but not limited to: otitis media, otitis externa, eustachian tube dysfunction, cerumen impaction Right TM slightly erythematous with opacified, discolored fluid noted behind it. Amoxicillin  875mg  BID was prescribed for otitis media secondary to viral infection. Recommend OTC analgesics as needed for pain. Strict ED precautions were given and patient verbalized understanding.  Nasal congestion Afebrile, nontoxic-appearing, NAD. VSS. DDX includes but not limited to: COVID, flu, viral URI, sinusitis, allergic rhinitis COVID and flu were negative. Flonase  1 spray each nostril every day PRN was prescribed for congestion and post nasal drip. Strict ED precautions were given and patient verbalized understanding.   ED Discharge Orders  Ordered    amoxicillin  (AMOXIL ) 875 MG tablet  Every 12 hours        02/29/24 0931    fluticasone   (FLONASE ) 50 MCG/ACT nasal spray  Daily PRN        02/29/24 0931             PDMP not reviewed this encounter.     Basilia Ulanda SQUIBB, PA-C 02/29/24 [id number]

## 2024-02-29 NOTE — ED Triage Notes (Signed)
 C/O sneezing, chest congestion for three days. Denies fever or chills. Patient states she wants to be checked for covid. C/O headache

## 2024-03-01 ENCOUNTER — Telehealth: Payer: Self-pay | Admitting: Internal Medicine

## 2024-03-01 MED ORDER — FLUTICASONE PROPIONATE 50 MCG/ACT NA SUSP: 1.0000 | Freq: Every day | NASAL | 0 refills | Status: AC | PRN

## 2024-03-01 MED ORDER — AMOXICILLIN 875 MG PO TABS: 875.0000 mg | ORAL_TABLET | Freq: Two times a day (BID) | ORAL | 0 refills | Status: AC

## 2024-03-01 NOTE — Telephone Encounter (Signed)
RX sent to new pharmacy

## 2024-03-02 ENCOUNTER — Telehealth: Payer: Self-pay

## 2024-03-02 NOTE — Telephone Encounter (Signed)
 The patient was called back and verified their identity was two patient identifiers. The patient was informed that the provider suggested that they try crushing the pill and mixing with applesauce. Pt verbalized understanding and was encouraged to try this and call back if she was still have issues.
